# Patient Record
Sex: Male | Born: 1940
Health system: Southern US, Community
[De-identification: ages and names within clinical notes are randomized; demographics above are authoritative.]

## PROBLEM LIST (undated history)

## (undated) DIAGNOSIS — I10 Essential (primary) hypertension: Secondary | ICD-10-CM

## (undated) DIAGNOSIS — C439 Malignant melanoma of skin, unspecified: Secondary | ICD-10-CM

## (undated) DIAGNOSIS — Z951 Presence of aortocoronary bypass graft: Secondary | ICD-10-CM

## (undated) DIAGNOSIS — H9190 Unspecified hearing loss, unspecified ear: Secondary | ICD-10-CM

## (undated) HISTORY — DX: Essential (primary) hypertension: I10

## (undated) HISTORY — DX: Unspecified hearing loss, unspecified ear: H91.90

## (undated) HISTORY — DX: Presence of aortocoronary bypass graft: Z95.1

## (undated) HISTORY — PX: CATARACT EXTRACTION: SUR2

## (undated) HISTORY — DX: Malignant melanoma of skin, unspecified: C43.9

---

## 1999-04-17 ENCOUNTER — Encounter (INDEPENDENT_AMBULATORY_CARE_PROVIDER_SITE_OTHER): Payer: Self-pay | Admitting: *Deleted

## 1999-04-17 ENCOUNTER — Ambulatory Visit (HOSPITAL_COMMUNITY): Admission: RE | Admit: 1999-04-17 | Discharge: 1999-04-17 | Payer: Self-pay | Admitting: Gastroenterology

## 1999-04-27 ENCOUNTER — Inpatient Hospital Stay (HOSPITAL_COMMUNITY): Admission: RE | Admit: 1999-04-27 | Discharge: 1999-05-01 | Payer: Self-pay | Admitting: *Deleted

## 1999-04-27 ENCOUNTER — Encounter: Payer: Self-pay | Admitting: *Deleted

## 1999-04-28 ENCOUNTER — Encounter: Payer: Self-pay | Admitting: Cardiothoracic Surgery

## 1999-04-30 ENCOUNTER — Encounter: Payer: Self-pay | Admitting: Cardiothoracic Surgery

## 1999-05-30 ENCOUNTER — Encounter (HOSPITAL_COMMUNITY): Admission: RE | Admit: 1999-05-30 | Discharge: 1999-08-28 | Payer: Self-pay | Admitting: *Deleted

## 2009-02-18 ENCOUNTER — Encounter (INDEPENDENT_AMBULATORY_CARE_PROVIDER_SITE_OTHER): Payer: Self-pay | Admitting: Otolaryngology

## 2009-02-18 ENCOUNTER — Ambulatory Visit (HOSPITAL_COMMUNITY): Admission: RE | Admit: 2009-02-18 | Discharge: 2009-02-18 | Payer: Self-pay | Admitting: Otolaryngology

## 2010-01-24 ENCOUNTER — Emergency Department (HOSPITAL_COMMUNITY): Admission: EM | Admit: 2010-01-24 | Discharge: 2010-01-25 | Payer: Self-pay | Admitting: Emergency Medicine

## 2010-07-20 LAB — URINALYSIS, ROUTINE W REFLEX MICROSCOPIC
Bilirubin Urine: NEGATIVE
Glucose, UA: NEGATIVE mg/dL
Ketones, ur: NEGATIVE mg/dL
Nitrite: NEGATIVE
Protein, ur: 30 mg/dL — AB
Specific Gravity, Urine: 1.028 (ref 1.005–1.030)
Urobilinogen, UA: 1 mg/dL (ref 0.0–1.0)
pH: 5.5 (ref 5.0–8.0)

## 2010-07-20 LAB — COMPREHENSIVE METABOLIC PANEL
ALT: 12 U/L (ref 0–53)
AST: 15 U/L (ref 0–37)
CO2: 27 mEq/L (ref 19–32)
Chloride: 98 mEq/L (ref 96–112)
Creatinine, Ser: 1.51 mg/dL — ABNORMAL HIGH (ref 0.4–1.5)
GFR calc Af Amer: 55 mL/min — ABNORMAL LOW (ref >60)
GFR calc non Af Amer: 46 mL/min — ABNORMAL LOW (ref >60)
Total Bilirubin: 1 mg/dL (ref 0.3–1.2)

## 2010-07-20 LAB — CBC
Hemoglobin: 13.3 g/dL (ref 13.0–17.0)
MCH: 32 pg (ref 26.0–34.0)
MCV: 92.4 fL (ref 78.0–100.0)
RBC: 4.15 MIL/uL — ABNORMAL LOW (ref 4.22–5.81)

## 2010-07-20 LAB — URINE MICROSCOPIC-ADD ON

## 2010-07-20 LAB — URINE CULTURE: Culture: NO GROWTH

## 2010-07-20 LAB — DIFFERENTIAL
Basophils Absolute: 0 10*3/uL (ref 0.0–0.1)
Basophils Relative: 0 % (ref 0–1)
Eosinophils Absolute: 0 10*3/uL (ref 0.0–0.7)
Eosinophils Relative: 0 % (ref 0–5)
Lymphocytes Relative: 4 % — ABNORMAL LOW (ref 12–46)

## 2010-07-20 LAB — LIPASE, BLOOD: Lipase: 17 U/L (ref 11–59)

## 2010-08-10 LAB — CBC
Platelets: 166 10*3/uL (ref 150–400)
WBC: 4.3 10*3/uL (ref 4.0–10.5)

## 2010-08-10 LAB — PROTIME-INR: Prothrombin Time: 13.4 seconds (ref 11.6–15.2)

## 2010-08-10 LAB — BASIC METABOLIC PANEL
BUN: 14 mg/dL (ref 6–23)
Calcium: 9 mg/dL (ref 8.4–10.5)
Creatinine, Ser: 0.82 mg/dL (ref 0.4–1.5)
GFR calc non Af Amer: >60 mL/min (ref >60)

## 2010-08-10 LAB — APTT: aPTT: 28 seconds (ref 24–37)

## 2010-09-22 NOTE — Procedures (Signed)
[place]. [facility]  Patient:    Henry Gaines                         MRN: [id number] Proc. Date: 04/17/99 Adm. Date:  [id number] Disc. Date: [id number] Attending:  Louie Bun CC:         Redmond Baseman, M.D.                           Procedure Report  PROCEDURE:  Colonoscopy.  SURGEON:  John C. Madilyn Fireman, M.D.  INDICATIONS:  Heme positive stools.  PROCEDURE:  The patient was placed in the left lateral decubitus position and placed on the pulse monitor with continuous low-flow oxygen delivered by nasal cannula.  He was sedated with 100 mg IV Demerol and 10 mg IV Versed.  The Olympus video colonoscope was inserted into the rectum and advanced to the cecum, confirmed by transillumination of McBurneys point and visualization of the ileocecal valve and appendiceal orifice.  The prep was somewhat suboptimal in the cecum and terminal ileum and unfortunately, brownish material was adherent to the lens precluding adequate visualization, I could not wash this off and had to withdraw the scope, clean it and reinsert it all the way back to the cecum.  At this point, lavage of the cecum allowed for adequate visualization and further lavage was required at some dependent portions of the rest of the colon.  However, a good iew was eventually obtained.  The cecum, ascending, transverse and descending colon all appeared normal with no masses, polyps, diverticuli or other mucosa abnormalities. Within the sigmoid colon were seen several diverticuli and no other abnormalities. Within the rectum was seen an 8 mm polyp at approximately 10 cm, which was fulgurated by hot biopsy.  The remainder of the rectum appeared normal and retroflexed view of the anus revealed no obviously enlarged internal hemorrhoids. The colonoscope is then withdrawn and the patient returned to the recovery room in stable condition.  He tolerated the procedure well and there were no  immediate complications.  IMPRESSION: 1. Small rectal polyp. 2. Diverticulosis.  PLAN:  Await histology for determination of interval and method for future colon screening. DD:  04/17/99 TD:  04/17/99 Job: [id number]

## 2010-09-22 NOTE — Consult Note (Signed)
[place]. [facility]  Patient:    Henry Gaines                         MRN: [id number] Proc. Date: 04/27/99 Adm. Date:  [id number] Disc. Date: [id number] Attending:  Waldo Laine CC:         Gwenith Daily. Tyrone Sage, M.D.                          Consultation Report  REASON FOR CONSULTATION:  Coronary occlusive disease.  HISTORY OF PRESENT ILLNESS:  The patient is a 70 year old male with a history of hypertension and a distant history of smoking who has noted, for approximately ne year, at times "feeling weak" and fatiguing easily with shortness of breath. Over the past several months, these symptoms have continued to progressively worsen ith less exertion.  At times, the patient notes he becomes exhausted just taking a shower.  Over the past several weeks to a month, he has noted a burning type of  discomfort starting at his umbilicus and radiating up into his chest but without nausea or vomiting.  It does not radiate into the arm.  Because of these symptoms, he was referred for a Cardiolite stress test by Dr. Modesto Charon.  This was performed which demonstrated anteroapical, anterior, and inferior defects with reversibility suggestive of multivessel disease and diffuse hypokinesia with ejection fraction of 43%.  The patient has no previous history of myocardial infarction.  Cardiac catheterization was performed by Dr. Fraser Din today which shows significant three-vessel disease including total occlusion of the right coronary artery, 70% stenosis of the circumflex, and 80% stenosis of the LAD involving the diagonal branches.  Ejection fraction at 40 to 45%.  PAST MEDICAL HISTORY:  Significant for hypertension.  This was first noted about two months ago, and the patient has recently been started on hydrochlorothiazide.  CURRENT MEDICATIONS:  Hydrochlorothiazide 25 mg p.o. q.d.  ALLERGIES:  None known.  PAST SURGICAL HISTORY:  Excision of a pilonidal  cyst 35 years ago.  No other previous hospitalization.  FAMILY HISTORY:  Significant for coronary occlusive disease.  His father died of a myocardial infarction at age 74.  His mother died at age 62 from colon cancer.  SOCIAL HISTORY:  He has three children.  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  REVIEW OF SYSTEMS:  The patient denies any constitutional symptoms other than what was mentioned above.  He denies any syncope or TIAs.  He has noted in the distant past some tingling in his right leg, but this has resolved and has not recurred. He denies any blood in his stool or urine, though he does note that he has had guaiac-positive stools and underwent colonoscopy with polyp removal in December of this year.  He denies any endocrine problems.  He denies any history of diabetes.  PHYSICAL EXAMINATION:  VITAL SIGNS:  The patients blood pressure is 110/60, heart rate 69 and sinus, respiratory rate 18.  NEUROLOGIC:  The patient is awake, alert, and neurologically intact.  NECK:  He has no carotid bruits.  He has no jugular venous distension.  He has o palpable thyroid.  LUNGS:  Clear bilaterally.  HEART:  He has a normal S1, S2 without murmurs or gallops.  ABDOMEN:  Benign without palpable organomegaly or tenderness.  He has normal caliber of his aorta.  EXTREMITIES:  Lower extremities reveal adequate vein for bypass.  He has  palpable DP and PT pulses bilaterally.  I have reviewed the patients films, and I have discussed the risks and options with him.  With significant symptoms, positive Cardiolite stress test, and significant three-vessel coronary occlusive disease, I agree with the recommendation for coronary artery bypass grafting and have recommended this to the patient.  The risks and options were discussed with the patient including the risk of death, infection, stroke, myocardial infarction, bleeding, and blood transfusion.  The patient has had his questions  answered and is willing to proceed. We will plan bypass surgery December 22.  The patient is agreeable with this plan. DD:  04/27/99 TD:  04/27/99 Job: [id number]

## 2010-09-22 NOTE — Cardiovascular Report (Signed)
[place]. [facility]  Patient:    Henry Gaines                         MRN: [id number] Proc. Date: 04/27/99 Adm. Date:  [id number] Attending:  Waldo Laine CC:         Redmond Baseman, M.D.                        Cardiac Catheterization  PROCEDURE PERFORMED:  Left heart catheterization, coronary angiography, single-plane ventriculogram, left internal mammary artery.  INDICATION FOR PROCEDURE:  Reversible defect on nuclear thallium suggestive of multivessel disease, diffuse hypokinesis around 43%, and unstable angina.  REFERRING PHYSICIAN:  Redmond Baseman, M.D.  PROCEDURE:  After obtaining written informed consent, the patient was brought to the cardiac catheterization lab in a postabsorptive state.  Preoperative sedation was achieved using IV Versed, IV Benadryl.  The right femoral head was prepped nd draped in the usual sterile fashion.  Local anesthesia was achieved using 1% Xylocaine.  A 6-French hemostasis sheath was placed into the right femoral artery using a modified Seldinger technique.  Selective coronary angiography was performed using a JL4 and JR4 Judkins catheter.  Nonionic contrast was used and was hand injected for the coronaries.  Single-plane ventriculogram was performed in the AO position using a 6-French pigtail curved catheter.  Nonionic contrast was used nd was power injected.  All catheter exchanges were made over a guide wire, and a hemostasis sheath was flushed after each catheter exchange.  The left internal mammary artery was engaged using the JR4.  Following the procedure, the films were reviewed with Dr. Katrinka Blazing, and it was felt that the disease was more amenable to surgical revascularization, and a surgical consultation was obtained.  The patient was transferred back to the holding area in satisfactory condition.  The hemostasis sheath was removed.  Hemostasis was achieved using digital  pressure.  FINDINGS:  The aortic pressure was 109/64; LV pressure was 109/18.  Single-plane ventriculogram revealed inferior basilar akinesis with anterior hypokinesis.  The ejection fraction was approximately 45%.  Coronary angiography: 1. The left main coronary artery bifurcated into the left anterior descending and    circumflex vessel.  The left main coronary artery was short.  There was no    significant disease in the left main coronary artery. 2. The left anterior descending artery gave rise to a large D1 and moderate D2    and a ______  apical recurrent bridge.  There was a 70% ostial LAD lesion    followed by an 80% proximal lesion.  This was followed by a trifurcation lesion    involving the first septal, first diagonal, and LAD branch up to 50-60%. 3. The circumflex vessel gave rise to a moderate OM-1, large OM-2, and small OM-3.    There was left-to-right collaterals from the circumflex vessel, and the    circumflex had luminal irregularities up to 30%. 4. The right coronary artery was dominant for the posterior circulation and was    subtotally occluded at its mid vessel point.  IMPRESSION:  Critical disease involving the proximal LAD, ostial LAD, and trifurcating branch.  The right coronary artery was subtotally occluded.  The left internal mammary artery was large and patent.  RECOMMENDATION:  Surgical revascularization. DD:  04/27/99 TD:  04/28/99 Job: [id number]

## 2010-10-27 ENCOUNTER — Emergency Department (HOSPITAL_COMMUNITY)
Admission: EM | Admit: 2010-10-27 | Discharge: 2010-10-27 | Disposition: A | Discharge status: Home / Self Care | Payer: Medicare Other | Attending: Emergency Medicine | Admitting: Emergency Medicine

## 2010-10-27 ENCOUNTER — Emergency Department (HOSPITAL_COMMUNITY): Payer: Medicare Other

## 2010-10-27 DIAGNOSIS — IMO0001 Reserved for inherently not codable concepts without codable children: Secondary | ICD-10-CM | POA: Insufficient documentation

## 2010-10-27 DIAGNOSIS — R413 Other amnesia: Secondary | ICD-10-CM | POA: Insufficient documentation

## 2010-10-27 DIAGNOSIS — M25519 Pain in unspecified shoulder: Secondary | ICD-10-CM | POA: Insufficient documentation

## 2010-10-27 DIAGNOSIS — W11XXXA Fall on and from ladder, initial encounter: Secondary | ICD-10-CM | POA: Insufficient documentation

## 2010-10-27 DIAGNOSIS — I10 Essential (primary) hypertension: Secondary | ICD-10-CM | POA: Insufficient documentation

## 2010-10-27 DIAGNOSIS — K219 Gastro-esophageal reflux disease without esophagitis: Secondary | ICD-10-CM | POA: Insufficient documentation

## 2010-10-27 DIAGNOSIS — R404 Transient alteration of awareness: Secondary | ICD-10-CM | POA: Insufficient documentation

## 2010-10-27 DIAGNOSIS — M25559 Pain in unspecified hip: Secondary | ICD-10-CM | POA: Insufficient documentation

## 2010-10-27 DIAGNOSIS — E785 Hyperlipidemia, unspecified: Secondary | ICD-10-CM | POA: Insufficient documentation

## 2010-10-27 DIAGNOSIS — S060X9A Concussion with loss of consciousness of unspecified duration, initial encounter: Secondary | ICD-10-CM | POA: Insufficient documentation

## 2010-10-27 DIAGNOSIS — S0100XA Unspecified open wound of scalp, initial encounter: Secondary | ICD-10-CM | POA: Insufficient documentation

## 2010-10-27 DIAGNOSIS — Z951 Presence of aortocoronary bypass graft: Secondary | ICD-10-CM | POA: Insufficient documentation

## 2010-10-27 DIAGNOSIS — Y92009 Unspecified place in unspecified non-institutional (private) residence as the place of occurrence of the external cause: Secondary | ICD-10-CM | POA: Insufficient documentation

## 2011-05-21 DIAGNOSIS — D3130 Benign neoplasm of unspecified choroid: Secondary | ICD-10-CM | POA: Diagnosis not present

## 2011-06-19 DIAGNOSIS — D1801 Hemangioma of skin and subcutaneous tissue: Secondary | ICD-10-CM | POA: Diagnosis not present

## 2011-06-19 DIAGNOSIS — L570 Actinic keratosis: Secondary | ICD-10-CM | POA: Diagnosis not present

## 2011-09-20 DIAGNOSIS — Z125 Encounter for screening for malignant neoplasm of prostate: Secondary | ICD-10-CM | POA: Diagnosis not present

## 2011-09-20 DIAGNOSIS — E785 Hyperlipidemia, unspecified: Secondary | ICD-10-CM | POA: Diagnosis not present

## 2011-09-20 DIAGNOSIS — I10 Essential (primary) hypertension: Secondary | ICD-10-CM | POA: Diagnosis not present

## 2011-09-24 DIAGNOSIS — I10 Essential (primary) hypertension: Secondary | ICD-10-CM | POA: Diagnosis not present

## 2011-09-24 DIAGNOSIS — Z23 Encounter for immunization: Secondary | ICD-10-CM | POA: Diagnosis not present

## 2011-09-24 DIAGNOSIS — E785 Hyperlipidemia, unspecified: Secondary | ICD-10-CM | POA: Diagnosis not present

## 2011-09-24 DIAGNOSIS — Z125 Encounter for screening for malignant neoplasm of prostate: Secondary | ICD-10-CM | POA: Diagnosis not present

## 2011-09-24 DIAGNOSIS — Z0000 Encounter for general adult medical examination without abnormal findings: Secondary | ICD-10-CM | POA: Diagnosis not present

## 2011-11-01 DIAGNOSIS — H903 Sensorineural hearing loss, bilateral: Secondary | ICD-10-CM | POA: Diagnosis not present

## 2011-11-07 IMAGING — CT CT HEAD W/O CM
2 series · 16 of 30 positions shown, 18 images · non-contrast
Comparison: None.

CLINICAL DATA: Fell off ladder with laceration to the back of the
head

CT HEAD WITHOUT CONTRAST
TECHNIQUE: Contiguous axial images were obtained from the base of
the skull through the vertex without contrast.

[Series 2: head w/o · axial · non-contrast · 0.49mm/px · z∈[+123,+248]mm · 8 of 33 slices shown, 10 images]
[im 4/33  brain]
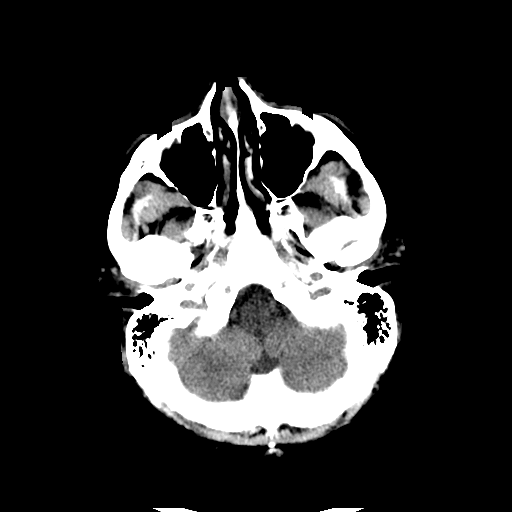
[im 4/33  bone]
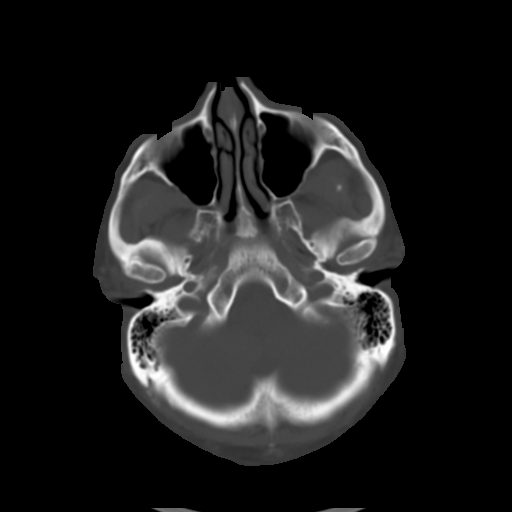
[im 8/33  brain]
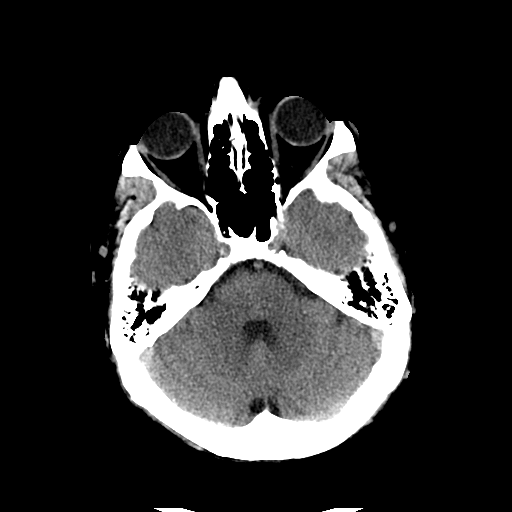
[im 11/33  brain]
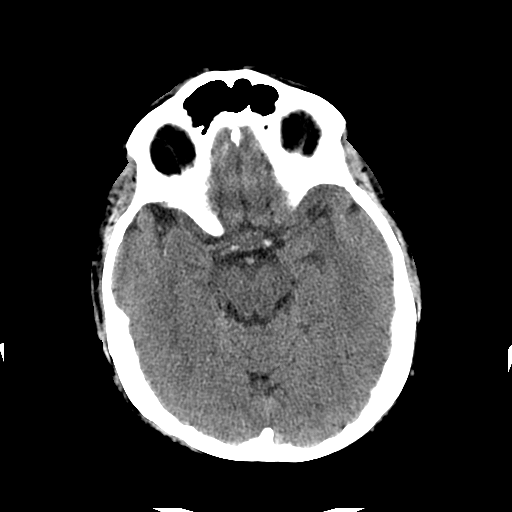
[im 15/33  brain]
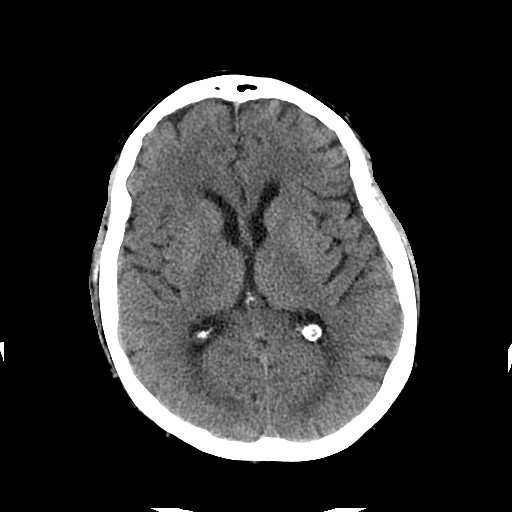
[im 18/33  brain]
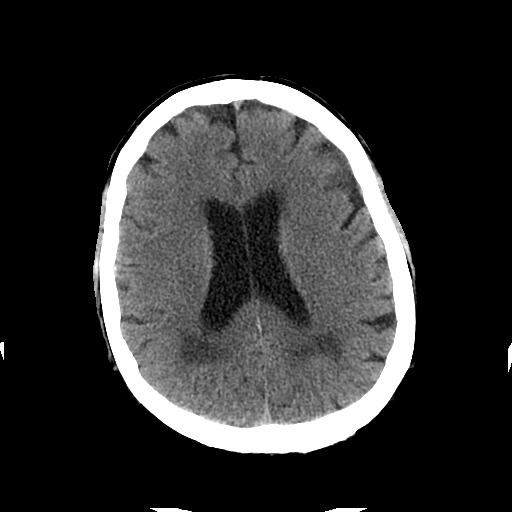
[im 18/33  bone]
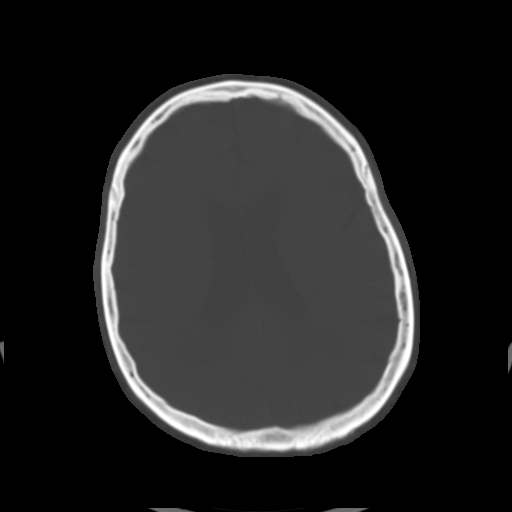
[im 22/33  brain]
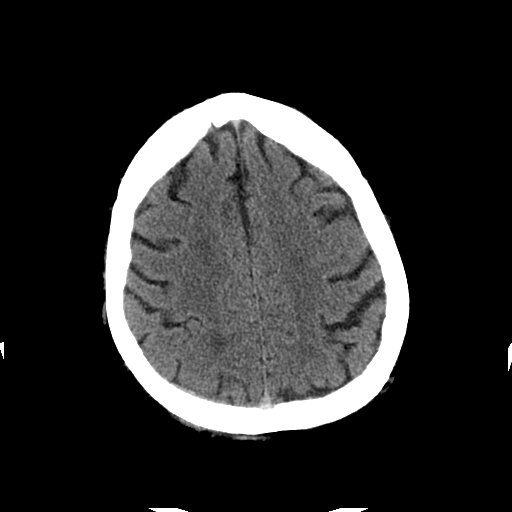
[im 25/33  brain]
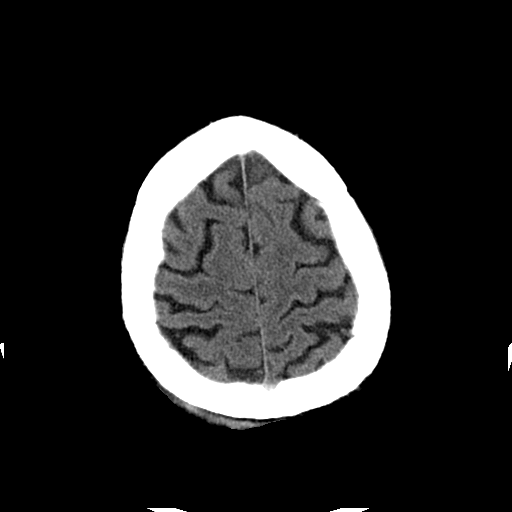
[im 29/33  brain]
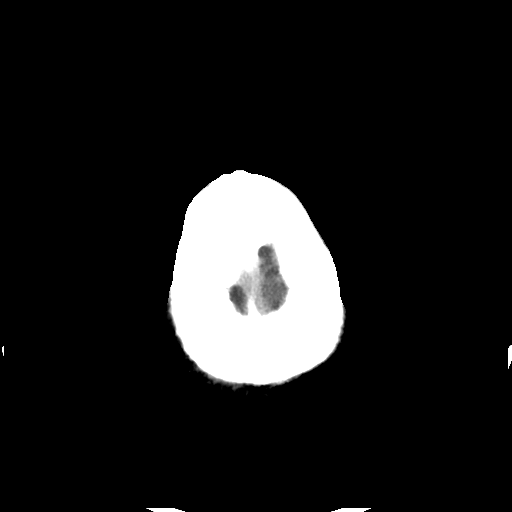

[Series 3: head w/o bone · axial · non-contrast · 0.49mm/px · z∈[+123,+251]mm · 8 of 65 slices shown]
[im 7/65  bone]
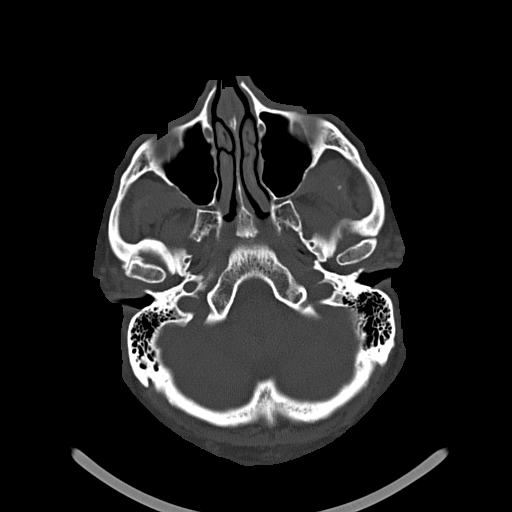
[im 14/65  bone]
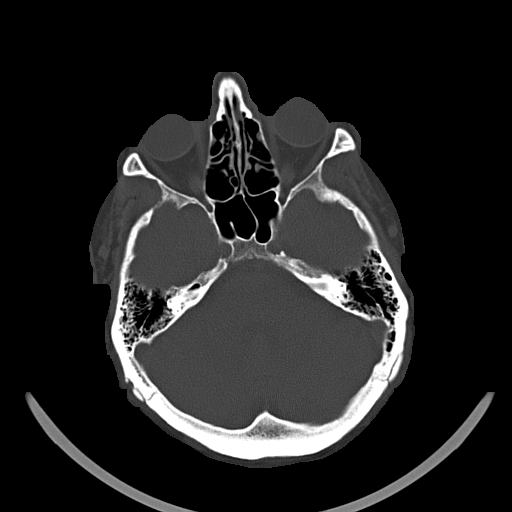
[im 21/65  bone]
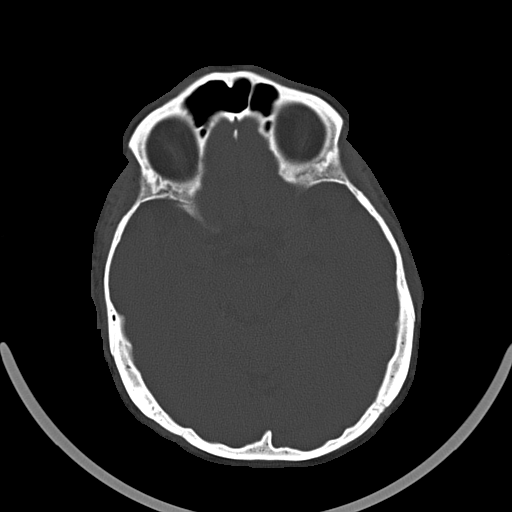
[im 27/65  bone]
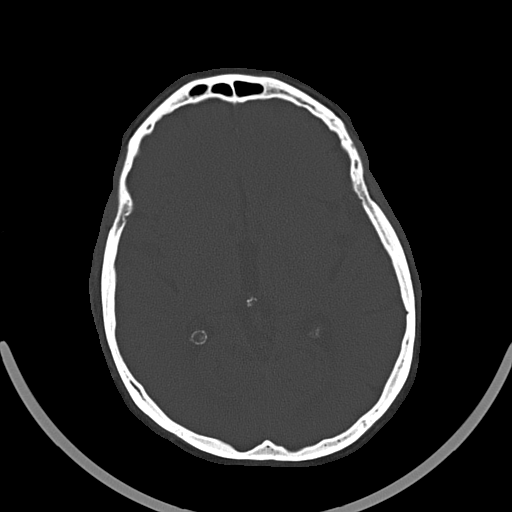
[im 38/65  bone]
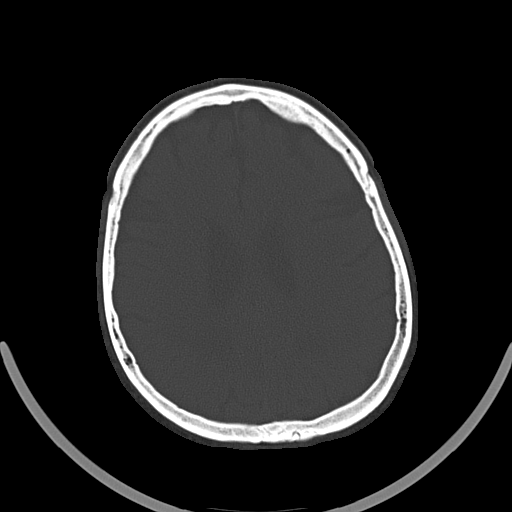
[im 44/65  bone]
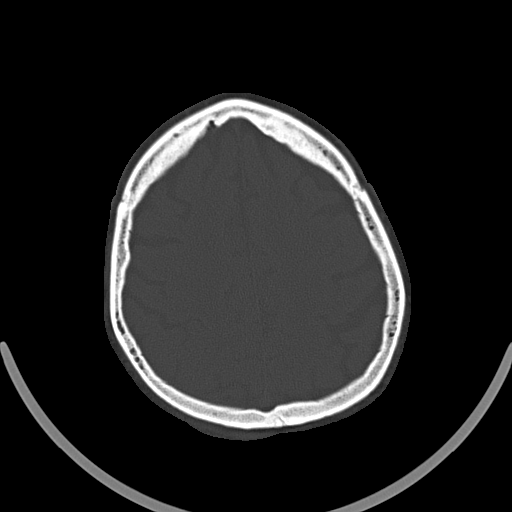
[im 51/65  bone]
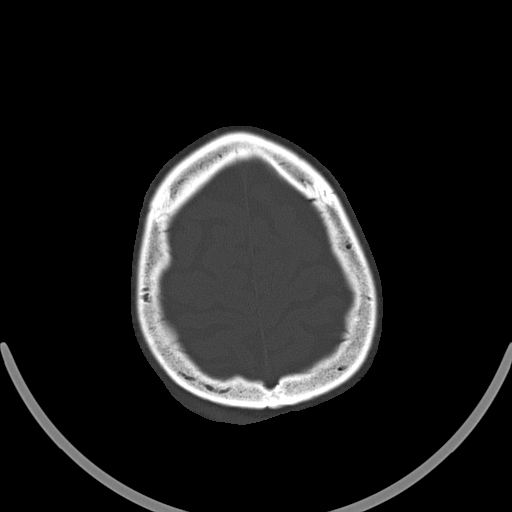
[im 58/65  bone]
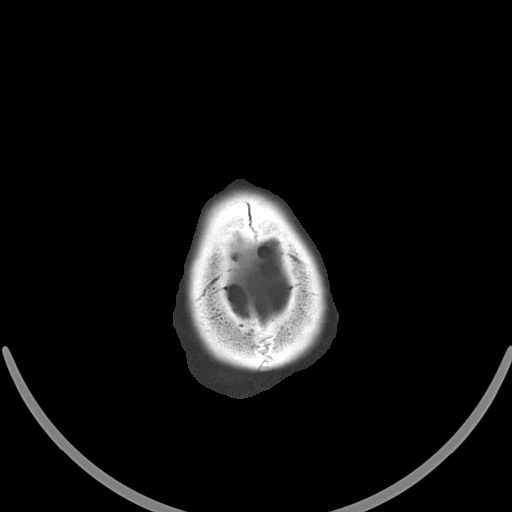

[16 of 30 positions shown; findings below may reference images not displayed]

FINDINGS: The ventricular system is slightly prominent as are the
cortical sulci, indicative of diffuse atrophy.  The septum is
midline in position.  Moderate small vessel ischemic change is
noted particularly bilaterally in the posterior parietal regions.
No hemorrhage, mass lesion, or acute infarction is seen.  On bone
window images, a small retention cyst is noted in the lateral right
maxillary sinus.  The remainder of the paranasal sinuses are clear
and the mastoid air cells are pneumatized.  No acute calvarial
abnormality is seen.  A probable small right posterior parietal
scalp hematoma is present.
IMPRESSION: 1.  Atrophy and moderate small vessel ischemic change.  No acute
intracranial abnormality.
2.  Small right post parietal scalp hematoma.

## 2012-05-03 DIAGNOSIS — Z23 Encounter for immunization: Secondary | ICD-10-CM | POA: Diagnosis not present

## 2012-10-02 DIAGNOSIS — I10 Essential (primary) hypertension: Secondary | ICD-10-CM | POA: Diagnosis not present

## 2012-10-02 DIAGNOSIS — E785 Hyperlipidemia, unspecified: Secondary | ICD-10-CM | POA: Diagnosis not present

## 2012-10-02 DIAGNOSIS — I2510 Atherosclerotic heart disease of native coronary artery without angina pectoris: Secondary | ICD-10-CM | POA: Diagnosis not present

## 2012-10-06 DIAGNOSIS — I10 Essential (primary) hypertension: Secondary | ICD-10-CM | POA: Diagnosis not present

## 2012-10-06 DIAGNOSIS — I2510 Atherosclerotic heart disease of native coronary artery without angina pectoris: Secondary | ICD-10-CM | POA: Diagnosis not present

## 2012-10-06 DIAGNOSIS — R1310 Dysphagia, unspecified: Secondary | ICD-10-CM | POA: Diagnosis not present

## 2012-10-06 DIAGNOSIS — Z79899 Other long term (current) drug therapy: Secondary | ICD-10-CM | POA: Diagnosis not present

## 2012-10-06 DIAGNOSIS — Z0000 Encounter for general adult medical examination without abnormal findings: Secondary | ICD-10-CM | POA: Diagnosis not present

## 2012-10-06 DIAGNOSIS — L570 Actinic keratosis: Secondary | ICD-10-CM | POA: Diagnosis not present

## 2012-10-06 DIAGNOSIS — E785 Hyperlipidemia, unspecified: Secondary | ICD-10-CM | POA: Diagnosis not present

## 2012-10-06 DIAGNOSIS — N529 Male erectile dysfunction, unspecified: Secondary | ICD-10-CM | POA: Diagnosis not present

## 2012-10-27 DIAGNOSIS — L570 Actinic keratosis: Secondary | ICD-10-CM | POA: Diagnosis not present

## 2012-10-27 DIAGNOSIS — D235 Other benign neoplasm of skin of trunk: Secondary | ICD-10-CM | POA: Diagnosis not present

## 2012-11-10 DIAGNOSIS — R1314 Dysphagia, pharyngoesophageal phase: Secondary | ICD-10-CM | POA: Diagnosis not present

## 2012-11-10 DIAGNOSIS — Z1211 Encounter for screening for malignant neoplasm of colon: Secondary | ICD-10-CM | POA: Diagnosis not present

## 2012-12-08 DIAGNOSIS — K449 Diaphragmatic hernia without obstruction or gangrene: Secondary | ICD-10-CM | POA: Diagnosis not present

## 2012-12-08 DIAGNOSIS — K5730 Diverticulosis of large intestine without perforation or abscess without bleeding: Secondary | ICD-10-CM | POA: Diagnosis not present

## 2012-12-08 DIAGNOSIS — Z800 Family history of malignant neoplasm of digestive organs: Secondary | ICD-10-CM | POA: Diagnosis not present

## 2012-12-08 DIAGNOSIS — R1314 Dysphagia, pharyngoesophageal phase: Secondary | ICD-10-CM | POA: Diagnosis not present

## 2012-12-08 DIAGNOSIS — Z1211 Encounter for screening for malignant neoplasm of colon: Secondary | ICD-10-CM | POA: Diagnosis not present

## 2013-02-14 DIAGNOSIS — Z23 Encounter for immunization: Secondary | ICD-10-CM | POA: Diagnosis not present

## 2013-04-13 DIAGNOSIS — I2510 Atherosclerotic heart disease of native coronary artery without angina pectoris: Secondary | ICD-10-CM | POA: Diagnosis not present

## 2013-04-13 DIAGNOSIS — L570 Actinic keratosis: Secondary | ICD-10-CM | POA: Diagnosis not present

## 2013-04-13 DIAGNOSIS — E785 Hyperlipidemia, unspecified: Secondary | ICD-10-CM | POA: Diagnosis not present

## 2013-04-13 DIAGNOSIS — R1310 Dysphagia, unspecified: Secondary | ICD-10-CM | POA: Diagnosis not present

## 2013-04-13 DIAGNOSIS — Z0000 Encounter for general adult medical examination without abnormal findings: Secondary | ICD-10-CM | POA: Diagnosis not present

## 2013-04-13 DIAGNOSIS — Z79899 Other long term (current) drug therapy: Secondary | ICD-10-CM | POA: Diagnosis not present

## 2013-04-13 DIAGNOSIS — N529 Male erectile dysfunction, unspecified: Secondary | ICD-10-CM | POA: Diagnosis not present

## 2013-04-13 DIAGNOSIS — I10 Essential (primary) hypertension: Secondary | ICD-10-CM | POA: Diagnosis not present

## 2013-04-20 DIAGNOSIS — Z23 Encounter for immunization: Secondary | ICD-10-CM | POA: Diagnosis not present

## 2013-04-20 DIAGNOSIS — J069 Acute upper respiratory infection, unspecified: Secondary | ICD-10-CM | POA: Diagnosis not present

## 2013-04-20 DIAGNOSIS — E785 Hyperlipidemia, unspecified: Secondary | ICD-10-CM | POA: Diagnosis not present

## 2013-04-20 DIAGNOSIS — I10 Essential (primary) hypertension: Secondary | ICD-10-CM | POA: Diagnosis not present

## 2013-10-12 DIAGNOSIS — I2510 Atherosclerotic heart disease of native coronary artery without angina pectoris: Secondary | ICD-10-CM | POA: Diagnosis not present

## 2013-10-12 DIAGNOSIS — N529 Male erectile dysfunction, unspecified: Secondary | ICD-10-CM | POA: Diagnosis not present

## 2013-10-12 DIAGNOSIS — I10 Essential (primary) hypertension: Secondary | ICD-10-CM | POA: Diagnosis not present

## 2013-10-12 DIAGNOSIS — Z23 Encounter for immunization: Secondary | ICD-10-CM | POA: Diagnosis not present

## 2013-10-12 DIAGNOSIS — Z0000 Encounter for general adult medical examination without abnormal findings: Secondary | ICD-10-CM | POA: Diagnosis not present

## 2013-10-12 DIAGNOSIS — E785 Hyperlipidemia, unspecified: Secondary | ICD-10-CM | POA: Diagnosis not present

## 2014-10-29 DIAGNOSIS — I10 Essential (primary) hypertension: Secondary | ICD-10-CM | POA: Diagnosis not present

## 2014-10-29 DIAGNOSIS — E785 Hyperlipidemia, unspecified: Secondary | ICD-10-CM | POA: Diagnosis not present

## 2014-10-29 DIAGNOSIS — Z125 Encounter for screening for malignant neoplasm of prostate: Secondary | ICD-10-CM | POA: Diagnosis not present

## 2014-11-01 DIAGNOSIS — Z125 Encounter for screening for malignant neoplasm of prostate: Secondary | ICD-10-CM | POA: Diagnosis not present

## 2014-11-01 DIAGNOSIS — N400 Enlarged prostate without lower urinary tract symptoms: Secondary | ICD-10-CM | POA: Diagnosis not present

## 2014-11-01 DIAGNOSIS — Z0001 Encounter for general adult medical examination with abnormal findings: Secondary | ICD-10-CM | POA: Diagnosis not present

## 2014-11-01 DIAGNOSIS — N529 Male erectile dysfunction, unspecified: Secondary | ICD-10-CM | POA: Diagnosis not present

## 2014-11-01 DIAGNOSIS — L570 Actinic keratosis: Secondary | ICD-10-CM | POA: Diagnosis not present

## 2014-11-01 DIAGNOSIS — I10 Essential (primary) hypertension: Secondary | ICD-10-CM | POA: Diagnosis not present

## 2014-11-01 DIAGNOSIS — E784 Other hyperlipidemia: Secondary | ICD-10-CM | POA: Diagnosis not present

## 2015-02-14 DIAGNOSIS — D4981 Neoplasm of unspecified behavior of retina and choroid: Secondary | ICD-10-CM | POA: Diagnosis not present

## 2015-02-14 DIAGNOSIS — H524 Presbyopia: Secondary | ICD-10-CM | POA: Diagnosis not present

## 2015-11-18 DIAGNOSIS — Z125 Encounter for screening for malignant neoplasm of prostate: Secondary | ICD-10-CM | POA: Diagnosis not present

## 2015-11-18 DIAGNOSIS — Z0001 Encounter for general adult medical examination with abnormal findings: Secondary | ICD-10-CM | POA: Diagnosis not present

## 2015-11-18 DIAGNOSIS — N400 Enlarged prostate without lower urinary tract symptoms: Secondary | ICD-10-CM | POA: Diagnosis not present

## 2015-11-18 DIAGNOSIS — E784 Other hyperlipidemia: Secondary | ICD-10-CM | POA: Diagnosis not present

## 2015-11-18 DIAGNOSIS — I10 Essential (primary) hypertension: Secondary | ICD-10-CM | POA: Diagnosis not present

## 2015-11-18 DIAGNOSIS — L570 Actinic keratosis: Secondary | ICD-10-CM | POA: Diagnosis not present

## 2015-11-18 DIAGNOSIS — N529 Male erectile dysfunction, unspecified: Secondary | ICD-10-CM | POA: Diagnosis not present

## 2015-11-21 DIAGNOSIS — M1990 Unspecified osteoarthritis, unspecified site: Secondary | ICD-10-CM | POA: Diagnosis not present

## 2015-11-21 DIAGNOSIS — E784 Other hyperlipidemia: Secondary | ICD-10-CM | POA: Diagnosis not present

## 2015-11-21 DIAGNOSIS — R1314 Dysphagia, pharyngoesophageal phase: Secondary | ICD-10-CM | POA: Diagnosis not present

## 2015-11-21 DIAGNOSIS — Z0000 Encounter for general adult medical examination without abnormal findings: Secondary | ICD-10-CM | POA: Diagnosis not present

## 2015-11-21 DIAGNOSIS — Z125 Encounter for screening for malignant neoplasm of prostate: Secondary | ICD-10-CM | POA: Diagnosis not present

## 2015-11-21 DIAGNOSIS — H9190 Unspecified hearing loss, unspecified ear: Secondary | ICD-10-CM | POA: Diagnosis not present

## 2015-11-21 DIAGNOSIS — N529 Male erectile dysfunction, unspecified: Secondary | ICD-10-CM | POA: Diagnosis not present

## 2015-11-21 DIAGNOSIS — N400 Enlarged prostate without lower urinary tract symptoms: Secondary | ICD-10-CM | POA: Diagnosis not present

## 2015-11-21 DIAGNOSIS — I10 Essential (primary) hypertension: Secondary | ICD-10-CM | POA: Diagnosis not present

## 2015-11-21 DIAGNOSIS — Z6835 Body mass index (BMI) 35.0-35.9, adult: Secondary | ICD-10-CM | POA: Diagnosis not present

## 2015-11-21 DIAGNOSIS — I2510 Atherosclerotic heart disease of native coronary artery without angina pectoris: Secondary | ICD-10-CM | POA: Diagnosis not present

## 2015-11-22 ENCOUNTER — Other Ambulatory Visit: Payer: Self-pay | Admitting: Family Medicine

## 2015-11-22 DIAGNOSIS — I2583 Coronary atherosclerosis due to lipid rich plaque: Principal | ICD-10-CM

## 2015-11-22 DIAGNOSIS — I2510 Atherosclerotic heart disease of native coronary artery without angina pectoris: Secondary | ICD-10-CM

## 2015-11-23 ENCOUNTER — Ambulatory Visit
Admission: RE | Admit: 2015-11-23 | Discharge: 2015-11-23 | Disposition: A | Discharge status: Home / Self Care | Payer: Medicare Other | Source: Ambulatory Visit | Attending: Family Medicine | Admitting: Family Medicine

## 2015-11-23 DIAGNOSIS — I2510 Atherosclerotic heart disease of native coronary artery without angina pectoris: Secondary | ICD-10-CM

## 2015-11-23 DIAGNOSIS — I6521 Occlusion and stenosis of right carotid artery: Secondary | ICD-10-CM | POA: Diagnosis not present

## 2015-11-23 DIAGNOSIS — I2583 Coronary atherosclerosis due to lipid rich plaque: Principal | ICD-10-CM

## 2016-02-01 DIAGNOSIS — Z23 Encounter for immunization: Secondary | ICD-10-CM | POA: Diagnosis not present

## 2016-12-03 IMAGING — US US CAROTID DUPLEX BILAT
1 series · 13 of 24 positions shown · non-contrast
Comparison: None.

CLINICAL DATA: Coronary artery disease, hypertension and
hyperlipidemia.

EXAM:
BILATERAL CAROTID DUPLEX ULTRASOUND
TECHNIQUE: Gray scale imaging, color Doppler and duplex ultrasound were
performed of bilateral carotid and vertebral arteries in the neck.

[Series 1: us carotid duplex bilat · 0.08mm/px · 13 of 70 slices shown]
[im 1/70]
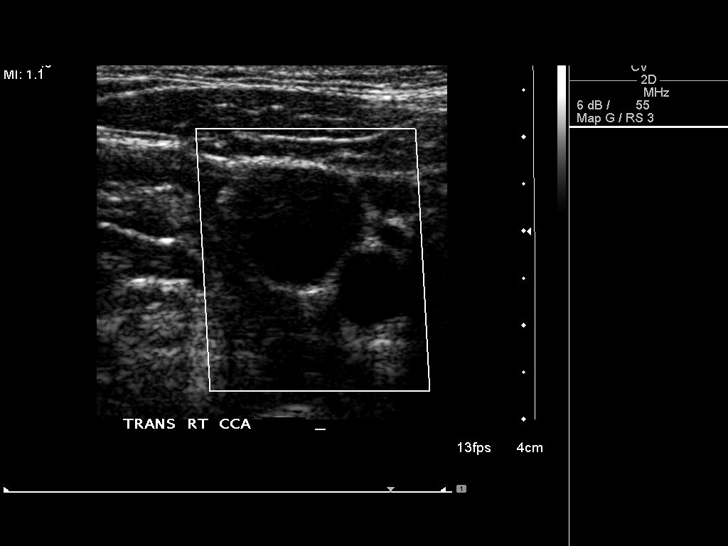
[im 7/70]
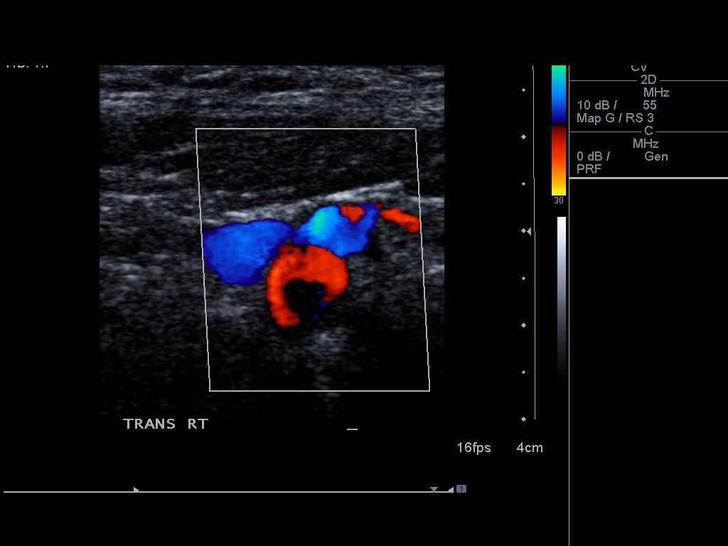
[im 13/70]
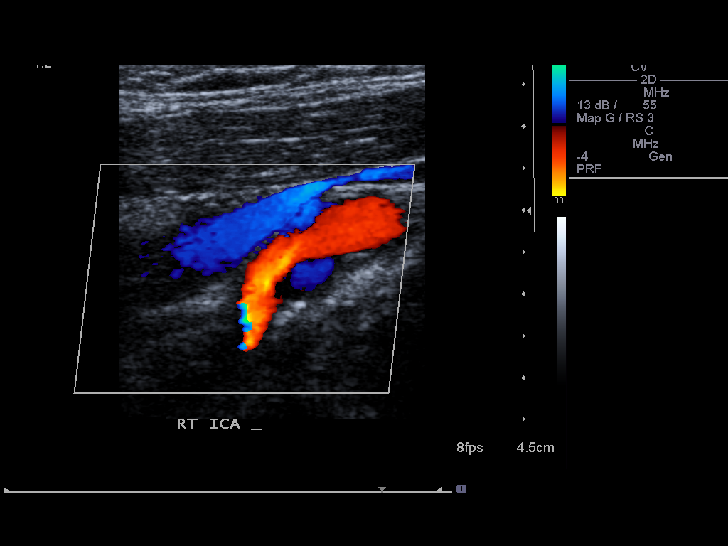
[im 19/70]
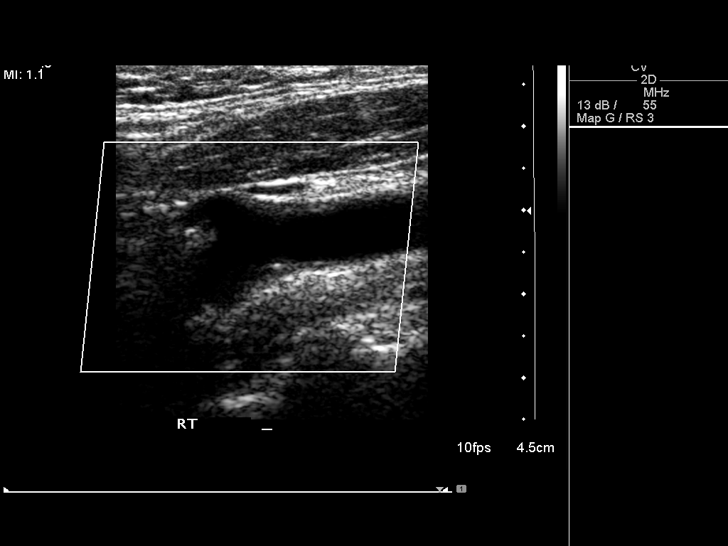
[im 25/70]
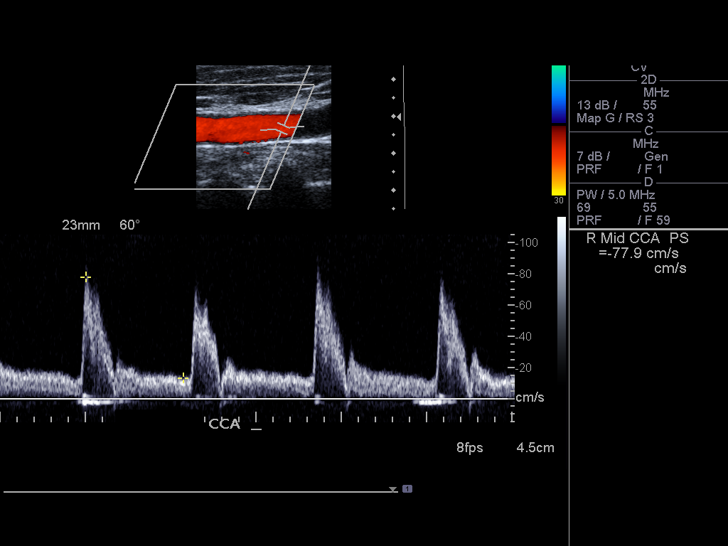
[im 31/70]
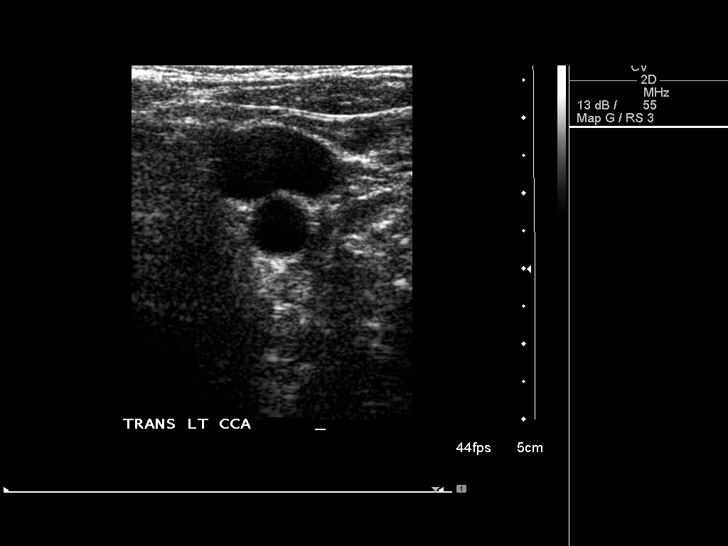
[im 37/70]
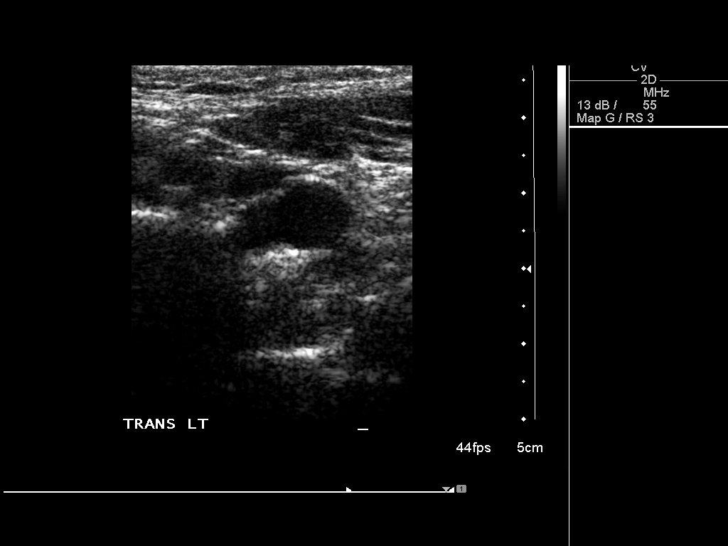
[im 40/70]
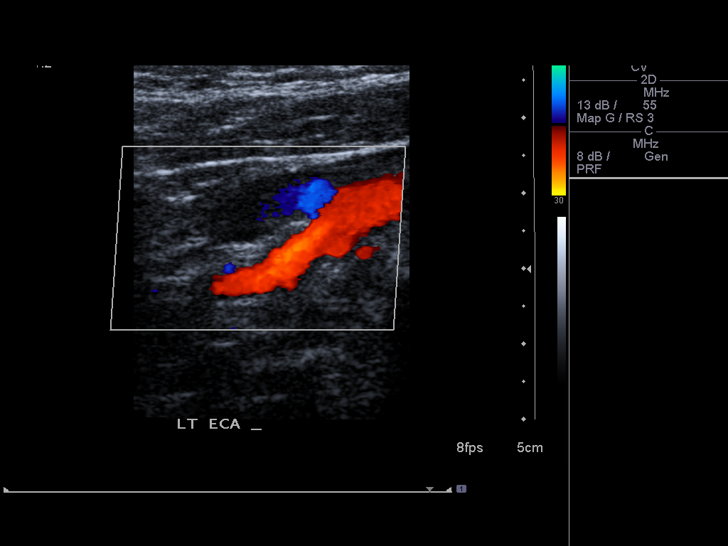
[im 46/70]
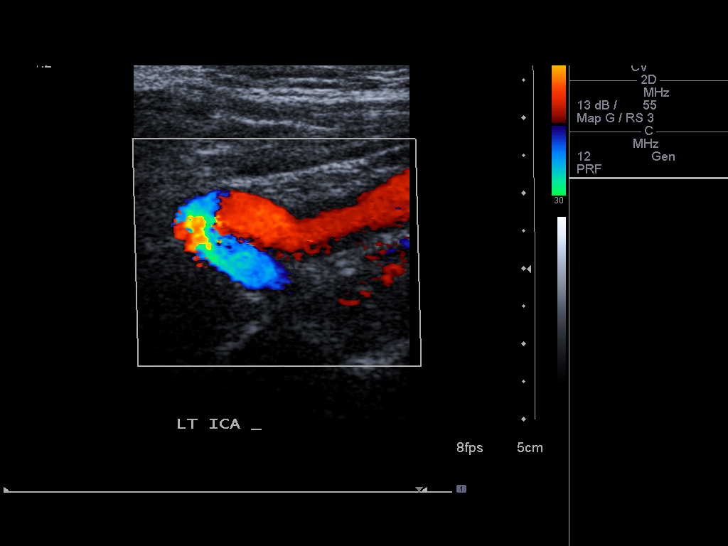
[im 52/70]
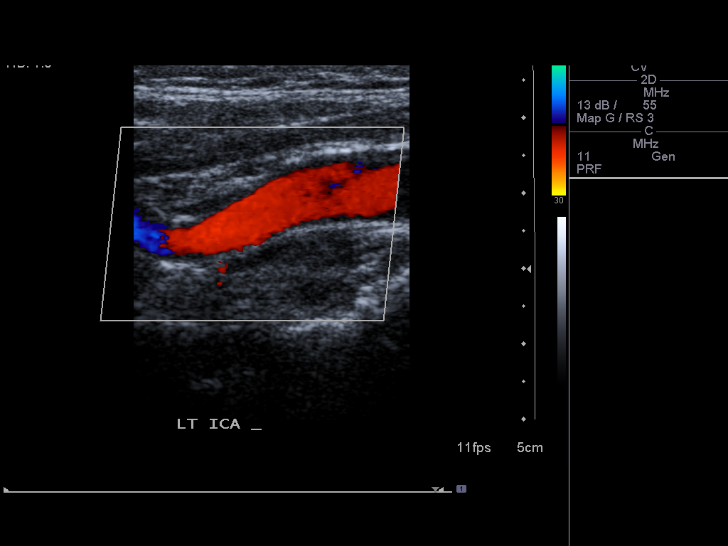
[im 58/70]
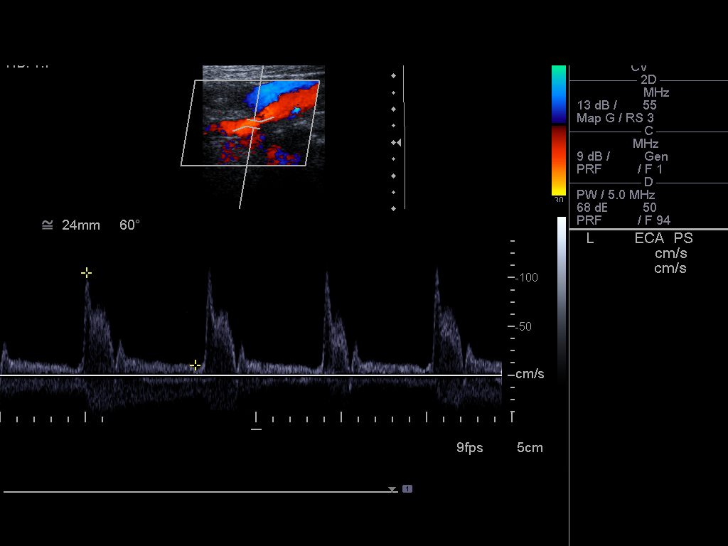
[im 64/70]
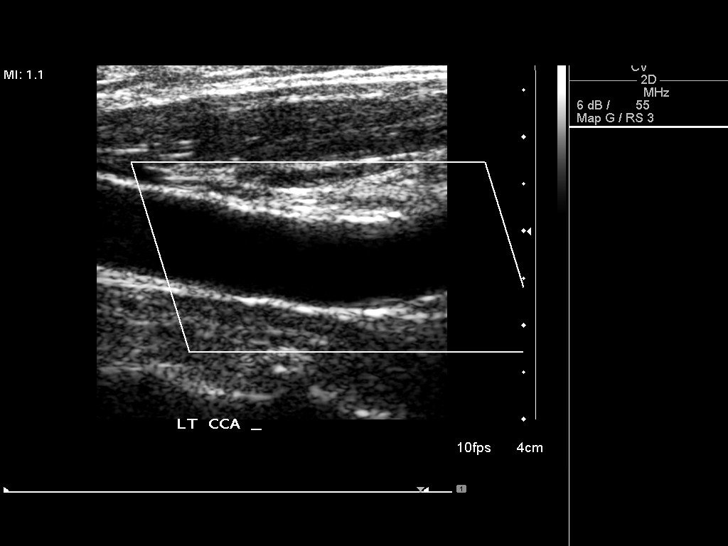
[im 70/70]
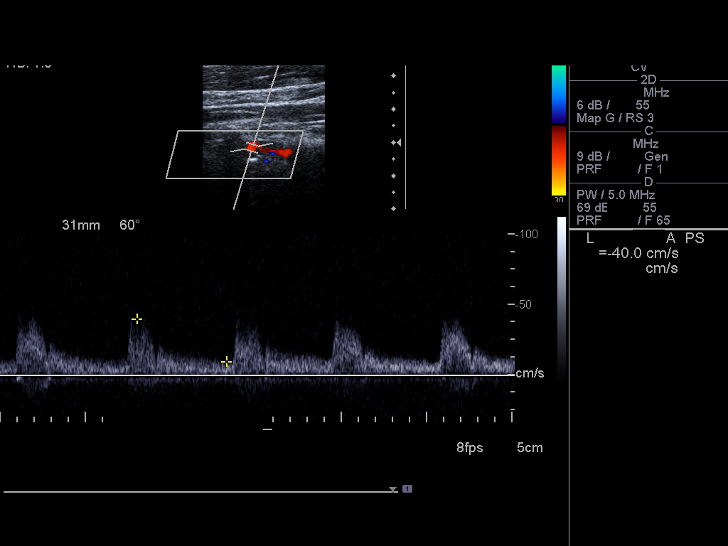

[13 of 24 positions shown; findings below may reference images not displayed]

FINDINGS: Criteria: Quantification of carotid stenosis is based on velocity
parameters that correlate the residual internal carotid diameter
with NASCET-based stenosis levels, using the diameter of the distal
internal carotid lumen as the denominator for stenosis measurement.

The following velocity measurements were obtained:

RIGHT

ICA:  100/13 cm/sec

CCA:  78/13 cm/sec

SYSTOLIC ICA/CCA RATIO:

DIASTOLIC ICA/CCA RATIO:

ECA:  126 cm/sec

LEFT

ICA:  132/34 distal, 82/23 mid cm/sec

CCA:  77/11 cm/sec

SYSTOLIC ICA/CCA RATIO:

DIASTOLIC ICA/CCA RATIO: 3.0 when comparing with the distal velocity

ECA:  105 cm/sec

RIGHT CAROTID ARTERY: There is a mild amount of partially calcified
plaque in the distal bulb and proximal ICA. Velocities and waveforms
are unremarkable and estimated right ICA stenosis is less than 50%.

RIGHT VERTEBRAL ARTERY: Antegrade flow with normal waveform and
velocity.

LEFT CAROTID ARTERY: The left internal carotid artery is extremely
tortuous. No focal plaque is identified. There is no evidence of
left ICA stenosis. Mildly elevated distal ICA velocities are
secondary to tortuosity.

LEFT VERTEBRAL ARTERY: Antegrade flow with normal waveform and
velocity.
IMPRESSION: Mild amount of plaque at the level of the right carotid bulb and
proximal right ICA. Estimated right ICA stenosis is less than 50%.
No evidence of left ICA plaque or stenosis.

## 2016-12-20 DIAGNOSIS — N529 Male erectile dysfunction, unspecified: Secondary | ICD-10-CM | POA: Diagnosis not present

## 2016-12-20 DIAGNOSIS — Z125 Encounter for screening for malignant neoplasm of prostate: Secondary | ICD-10-CM | POA: Diagnosis not present

## 2016-12-20 DIAGNOSIS — Z0000 Encounter for general adult medical examination without abnormal findings: Secondary | ICD-10-CM | POA: Diagnosis not present

## 2016-12-20 DIAGNOSIS — N400 Enlarged prostate without lower urinary tract symptoms: Secondary | ICD-10-CM | POA: Diagnosis not present

## 2016-12-20 DIAGNOSIS — I2510 Atherosclerotic heart disease of native coronary artery without angina pectoris: Secondary | ICD-10-CM | POA: Diagnosis not present

## 2016-12-20 DIAGNOSIS — R1314 Dysphagia, pharyngoesophageal phase: Secondary | ICD-10-CM | POA: Diagnosis not present

## 2016-12-20 DIAGNOSIS — I10 Essential (primary) hypertension: Secondary | ICD-10-CM | POA: Diagnosis not present

## 2016-12-20 DIAGNOSIS — E784 Other hyperlipidemia: Secondary | ICD-10-CM | POA: Diagnosis not present

## 2016-12-20 DIAGNOSIS — H9190 Unspecified hearing loss, unspecified ear: Secondary | ICD-10-CM | POA: Diagnosis not present

## 2016-12-24 DIAGNOSIS — Z0000 Encounter for general adult medical examination without abnormal findings: Secondary | ICD-10-CM | POA: Diagnosis not present

## 2016-12-24 DIAGNOSIS — I10 Essential (primary) hypertension: Secondary | ICD-10-CM | POA: Diagnosis not present

## 2016-12-24 DIAGNOSIS — Z125 Encounter for screening for malignant neoplasm of prostate: Secondary | ICD-10-CM | POA: Diagnosis not present

## 2016-12-24 DIAGNOSIS — Z23 Encounter for immunization: Secondary | ICD-10-CM | POA: Diagnosis not present

## 2016-12-24 DIAGNOSIS — N529 Male erectile dysfunction, unspecified: Secondary | ICD-10-CM | POA: Diagnosis not present

## 2016-12-24 DIAGNOSIS — E784 Other hyperlipidemia: Secondary | ICD-10-CM | POA: Diagnosis not present

## 2017-12-16 ENCOUNTER — Ambulatory Visit (INDEPENDENT_AMBULATORY_CARE_PROVIDER_SITE_OTHER): Payer: Medicare Other

## 2017-12-16 ENCOUNTER — Ambulatory Visit (INDEPENDENT_AMBULATORY_CARE_PROVIDER_SITE_OTHER): Payer: Medicare Other | Admitting: Orthopedic Surgery

## 2017-12-16 DIAGNOSIS — M79671 Pain in right foot: Secondary | ICD-10-CM | POA: Diagnosis not present

## 2017-12-16 DIAGNOSIS — M2021 Hallux rigidus, right foot: Secondary | ICD-10-CM

## 2017-12-24 DIAGNOSIS — M2021 Hallux rigidus, right foot: Secondary | ICD-10-CM | POA: Diagnosis not present

## 2017-12-24 DIAGNOSIS — G8918 Other acute postprocedural pain: Secondary | ICD-10-CM | POA: Diagnosis not present

## 2017-12-30 ENCOUNTER — Encounter (INDEPENDENT_AMBULATORY_CARE_PROVIDER_SITE_OTHER): Payer: Self-pay | Admitting: Orthopedic Surgery

## 2017-12-30 NOTE — Progress Notes (Signed)
   Office Visit Note   Patient: Henry Gaines           Date of Birth: 06/13/1940           MRN: [id number] Visit Date: 12/16/2017              Requested by: No referring provider defined for this encounter. PCP: Lawerance Cruel, MD  Chief Complaint  Patient presents with  . Right Foot - Pain      HPI: Patient is a 77 year old gentleman who presents with pain redness and swelling right great toe MTP joint.  Patient states he cannot wear closed toed shoes due to the bone spurs rubbing on the shoe.  He has a negative history of gout he states he is currently not smoking.  He is status post coronary artery bypass surgery 18 years ago.  Assessment & Plan: Visit Diagnoses:  1. Pain in right foot   2. Hallux rigidus, right foot     Plan: Recommended proceeding with fusion of the great toe MTP joint taking down the bony spurs risks and benefits were discussed including infection nonhealing of the bone need for additional surgery.  Patient states he understands and wishes to proceed at this time.  Follow-Up Instructions: Return in about 1 week (around 12/23/2017).   Ortho Exam  Patient is alert, oriented, no adenopathy, well-dressed, normal affect, normal respiratory effort. Examination patient has a good dorsalis pedis pulse he has essentially no range of motion of the right great toe MTP joint there are large bony spurs that are palpable there is no skin breakdown no redness no cellulitis no drainage no signs of infection or gout.  Imaging: No results found. No images are attached to the encounter.  Labs: Lab Results  Component Value Date   REPTSTATUS 01/25/2010 FINAL 01/24/2010   CULT NO GROWTH 01/24/2010     Lab Results  Component Value Date   ALBUMIN 3.6 01/24/2010    There is no height or weight on file to calculate BMI.  Orders:  Orders Placed This Encounter  Procedures  . XR Foot 2 Views Right   No orders of the defined types were placed in this  encounter.    Procedures: No procedures performed  Clinical Data: No additional findings.  ROS:  All other systems negative, except as noted in the HPI. Review of Systems  Objective: Vital Signs: There were no vitals taken for this visit.  Specialty Comments:  No specialty comments available.  PMFS History: There are no active problems to display for this patient.  History reviewed. No pertinent past medical history.  History reviewed. No pertinent family history.  History reviewed. No pertinent surgical history. Social History   Occupational History  . Not on file  Tobacco Use  . Smoking status: Not on file  Substance and Sexual Activity  . Alcohol use: Not on file  . Drug use: Not on file  . Sexual activity: Not on file

## 2017-12-31 ENCOUNTER — Ambulatory Visit (INDEPENDENT_AMBULATORY_CARE_PROVIDER_SITE_OTHER): Payer: Medicare Other | Admitting: Physician Assistant

## 2017-12-31 ENCOUNTER — Encounter (INDEPENDENT_AMBULATORY_CARE_PROVIDER_SITE_OTHER): Payer: Self-pay | Admitting: Orthopedic Surgery

## 2017-12-31 DIAGNOSIS — M2021 Hallux rigidus, right foot: Secondary | ICD-10-CM

## 2017-12-31 NOTE — Progress Notes (Signed)
   Office Visit Note   Patient: Henry Gaines           Date of Birth: 12-Feb-1941           MRN: [id number] Visit Date: 12/31/2017              Requested by: Lawerance Cruel, [place], [place] [address] PCP: Lawerance Cruel, MD   Assessment & Plan: Visit Diagnoses:  1. Hallux rigidus, right foot     Plan: The patient was counseled to elevate his right foot as much as possible.  He should continue to maintain nonweightbearing and ambulate with crutches or a walker in his postoperative shoe with no weightbearing through the forefoot.  He can use ice to the foot.  He will follow-up in 1 week.  We will plan to harvest the sutures if his edema is improved next visit.  Follow-Up Instructions: Return in about 1 week (around 01/07/2018).   Orders:  No orders of the defined types were placed in this encounter.  No orders of the defined types were placed in this encounter.     Procedures: No procedures performed   Clinical Data: No additional findings.   Subjective: Chief Complaint  Patient presents with  . Right Foot - Routine Post Op    HPI Patient-year-old male who is status post right foot first MTP arthrodesis on 12/25/2017.  He reports that he has had no pain postoperatively.  He does have a postoperative shoe and reports that he ambulated with his crutches for several days nonweightbearing on his right lower extremity but he became sore through his armpit and stopped using the crutches.  He has been trying to ambulate just on the heel of the right foot.  He has been elevating the foot on an ottoman.  Counseled the patient that he should elevate higher than the level of his heart as he still has some moderate edema of the foot.  We also discussed that he should be nonweightbearing and utilize his crutches or his walker to try to limit his weightbearing as much as possible.   Review of Systems   Objective: Vital Signs: There were no vitals taken for  this visit.  Physical Exam Patient is well-nourished well-developed and in no distress.  He ambulates with his postoperative shoe on his right foot with the weight on the heel of the right foot. Ortho Exam The right foot first MTP incision is clean dry and intact.  He does have moderate edema and ecchymosis over the area but no signs of cellulitis nontender to palpation sutures are intact. Specialty Comments:  No specialty comments available.  Imaging: No results found.   PMFS History: There are no active problems to display for this patient.  History reviewed. No pertinent past medical history.  History reviewed. No pertinent family history.  History reviewed. No pertinent surgical history. Social History   Occupational History  . Not on file  Tobacco Use  . Smoking status: Not on file  Substance and Sexual Activity  . Alcohol use: Not on file  . Drug use: Not on file  . Sexual activity: Not on file

## 2018-01-07 ENCOUNTER — Ambulatory Visit (INDEPENDENT_AMBULATORY_CARE_PROVIDER_SITE_OTHER): Payer: Medicare Other | Admitting: Physician Assistant

## 2018-01-07 ENCOUNTER — Encounter (INDEPENDENT_AMBULATORY_CARE_PROVIDER_SITE_OTHER): Payer: Self-pay | Admitting: Physician Assistant

## 2018-01-07 DIAGNOSIS — M2021 Hallux rigidus, right foot: Secondary | ICD-10-CM | POA: Diagnosis not present

## 2018-01-07 NOTE — Progress Notes (Signed)
   Office Visit Note   Patient: Henry Gaines           Date of Birth: Feb 04, 1941           MRN: [id number] Visit Date: 01/07/2018              Requested by: Lawerance Cruel, [place], [place] [address] PCP: Lawerance Cruel, MD   Assessment & Plan: Visit Diagnoses:  1. Hallux rigidus, right foot     Plan: The sutures were harvested this visit.  The patient will continue to Ace wrap the foot to help with edema control.  He can begin weightbearing as tolerated in his postoperative shoe.  He will follow-up in 2 weeks.  Follow-Up Instructions: Return in about 2 weeks (around 01/21/2018).   Orders:  No orders of the defined types were placed in this encounter.  No orders of the defined types were placed in this encounter.     Procedures: No procedures performed   Clinical Data: No additional findings.   Subjective: Chief Complaint  Patient presents with  . Right Foot - Follow-up    HPI The patient is a 77 year old male who is status post right first MTP arthrodesis on 12/25/2017.  He reports only occasional tingling type discomfort in the area but no real pain.  He is ambulating in a postoperative shoe and is not having any pain with ambulation.  His edema is much improved. Review of Systems   Objective: Vital Signs: There were no vitals taken for this visit.  Physical Exam Patient is a well-nourished well-developed male in no apparent distress.  He is well-groomed. Ortho Exam The right foot dorsal incision is healing well and sutures were removed without difficulty.  He has very mild edema.  He is nontender to palpation.  There are no signs of cellulitis.  He is Specialty Comments:  No specialty comments available.  Imaging: No results found.   PMFS History: There are no active problems to display for this patient.  History reviewed. No pertinent past medical history.  History reviewed. No pertinent family history.  History  reviewed. No pertinent surgical history. Social History   Occupational History  . Not on file  Tobacco Use  . Smoking status: Not on file  Substance and Sexual Activity  . Alcohol use: Not on file  . Drug use: Not on file  . Sexual activity: Not on file

## 2018-01-21 ENCOUNTER — Encounter (INDEPENDENT_AMBULATORY_CARE_PROVIDER_SITE_OTHER): Payer: Self-pay | Admitting: Orthopedic Surgery

## 2018-01-21 ENCOUNTER — Ambulatory Visit (INDEPENDENT_AMBULATORY_CARE_PROVIDER_SITE_OTHER): Payer: Medicare Other | Admitting: Physician Assistant

## 2018-01-21 VITALS — Ht 71.0 in | Wt 224.0 lb

## 2018-01-21 DIAGNOSIS — M2021 Hallux rigidus, right foot: Secondary | ICD-10-CM

## 2018-01-22 ENCOUNTER — Encounter (INDEPENDENT_AMBULATORY_CARE_PROVIDER_SITE_OTHER): Payer: Self-pay | Admitting: Physician Assistant

## 2018-01-22 NOTE — Progress Notes (Signed)
   Office Visit Note   Patient: Henry Gaines           Date of Birth: [birth year]           MRN: [id number] Visit Date: 01/21/2018              Requested by: Lawerance Cruel, [place], [place] [address] PCP: Lawerance Cruel, MD  Chief Complaint  Patient presents with  . Right Foot - Follow-up      HPI: Patient is a 77 year old male who is seen for postoperative follow-up following right first MTP arthrodesis on 12/25/2017.  He has no concerns in follow-up.  He reports he is not having any pain.  He has started walking in regular shoes and reports no pain with the transition to regular shoes.  He did buy [employer] with a stiff sole and reports that this is working out well.  His difficulties with postoperative edema are much improved.  He is very pleased with the result.  Assessment & Plan: Visit Diagnoses:  1. Hallux rigidus, right foot     Plan: He is going to continue to progress with his activities to tolerance, weight-bear as tolerated in a stiff soled shoe.Marland Kitchen  He will follow-up here in approximately 4 weeks with x-rays at the next visit or sooner should he have any difficulties in the interim.  Follow-Up Instructions: Return in about 4 weeks (around 02/18/2018).   Ortho Exam  Patient is alert, oriented, no adenopathy, well-dressed, normal affect, normal respiratory effort. The dorsal right great toe incision is healing well.  His edema is much improved.  He is nontender to palpation.  There are no signs of cellulitis.  Imaging: No results found. No images are attached to the encounter.  Labs: Lab Results  Component Value Date   REPTSTATUS 01/25/2010 FINAL 01/24/2010   CULT NO GROWTH 01/24/2010     Lab Results  Component Value Date   ALBUMIN 3.6 01/24/2010    Body mass index is 31.24 kg/m.  Orders:  No orders of the defined types were placed in this encounter.  No orders of the defined types were placed in this encounter.    Procedures: No procedures performed  Clinical Data: No additional findings.  ROS:  All other systems negative, except as noted in the HPI. Review of Systems  Objective: Vital Signs: Ht 5\' 11"  (1.803 m)   Wt 224 lb (101.6 kg)   BMI 31.24 kg/m   Specialty Comments:  No specialty comments available.  PMFS History: There are no active problems to display for this patient.  History reviewed. No pertinent past medical history.  History reviewed. No pertinent family history.  History reviewed. No pertinent surgical history. Social History   Occupational History  . Not on file  Tobacco Use  . Smoking status: Former Research scientist (life sciences)  . Smokeless tobacco: Never Used  . Tobacco comment: 30 years ago  Substance and Sexual Activity  . Alcohol use: Yes    Alcohol/week: 2.0 standard drinks    Types: 2 Glasses of wine per week  . Drug use: Never  . Sexual activity: Not Currently    Partners: Male

## 2018-02-03 DIAGNOSIS — I10 Essential (primary) hypertension: Secondary | ICD-10-CM | POA: Diagnosis not present

## 2018-02-03 DIAGNOSIS — Z125 Encounter for screening for malignant neoplasm of prostate: Secondary | ICD-10-CM | POA: Diagnosis not present

## 2018-02-03 DIAGNOSIS — E7800 Pure hypercholesterolemia, unspecified: Secondary | ICD-10-CM | POA: Diagnosis not present

## 2018-02-03 DIAGNOSIS — Z0000 Encounter for general adult medical examination without abnormal findings: Secondary | ICD-10-CM | POA: Diagnosis not present

## 2018-02-03 DIAGNOSIS — N529 Male erectile dysfunction, unspecified: Secondary | ICD-10-CM | POA: Diagnosis not present

## 2018-02-03 DIAGNOSIS — E669 Obesity, unspecified: Secondary | ICD-10-CM | POA: Diagnosis not present

## 2018-02-03 DIAGNOSIS — N400 Enlarged prostate without lower urinary tract symptoms: Secondary | ICD-10-CM | POA: Diagnosis not present

## 2018-02-03 DIAGNOSIS — Z23 Encounter for immunization: Secondary | ICD-10-CM | POA: Diagnosis not present

## 2018-02-18 ENCOUNTER — Encounter (INDEPENDENT_AMBULATORY_CARE_PROVIDER_SITE_OTHER): Payer: Self-pay | Admitting: Orthopedic Surgery

## 2018-02-18 ENCOUNTER — Ambulatory Visit (INDEPENDENT_AMBULATORY_CARE_PROVIDER_SITE_OTHER): Payer: Medicare Other | Admitting: Orthopedic Surgery

## 2018-02-18 VITALS — Ht 71.0 in | Wt 224.0 lb

## 2018-02-18 DIAGNOSIS — M2021 Hallux rigidus, right foot: Secondary | ICD-10-CM

## 2018-02-18 NOTE — Progress Notes (Signed)
   Office Visit Note   Patient: Henry Gaines           Date of Birth: [birth year]           MRN: [id number] Visit Date: 02/18/2018              Requested by: Lawerance Cruel, [place], [place] [address] PCP: Lawerance Cruel, MD  Chief Complaint  Patient presents with  . Right Foot - Routine Post Op    12/25/17 right GT MTP fusion       HPI: Patient is a 77 year old gentleman who presents status post right great toe MTP fusion.  Patient states he feels great he never has had any pain he states he does not need any follow-up x-rays.  Assessment & Plan: Visit Diagnoses:  1. Hallux rigidus, right foot     Plan: Patient will continue with regular shoewear recommended knee-high compression stockings to help with the swelling.  Follow-Up Instructions: Return if symptoms worsen or fail to improve.   Ortho Exam  Patient is alert, oriented, no adenopathy, well-dressed, normal affect, normal respiratory effort. Examination the incision is well-healed his toe is straight there is no pain with attempted range of motion.  There is a little bit of swelling in the foot and ankle but this is minimal.  Imaging: No results found. No images are attached to the encounter.  Labs: Lab Results  Component Value Date   REPTSTATUS 01/25/2010 FINAL 01/24/2010   CULT NO GROWTH 01/24/2010     Lab Results  Component Value Date   ALBUMIN 3.6 01/24/2010    Body mass index is 31.24 kg/m.  Orders:  No orders of the defined types were placed in this encounter.  No orders of the defined types were placed in this encounter.    Procedures: No procedures performed  Clinical Data: No additional findings.  ROS:  All other systems negative, except as noted in the HPI. Review of Systems  Objective: Vital Signs: Ht 5\' 11"  (1.803 m)   Wt 224 lb (101.6 kg)   BMI 31.24 kg/m   Specialty Comments:  No specialty comments available.  PMFS History: There are no  active problems to display for this patient.  History reviewed. No pertinent past medical history.  History reviewed. No pertinent family history.  History reviewed. No pertinent surgical history. Social History   Occupational History  . Not on file  Tobacco Use  . Smoking status: Former Research scientist (life sciences)  . Smokeless tobacco: Never Used  . Tobacco comment: 30 years ago  Substance and Sexual Activity  . Alcohol use: Yes    Alcohol/week: 2.0 standard drinks    Types: 2 Glasses of wine per week  . Drug use: Never  . Sexual activity: Not Currently    Partners: Male

## 2018-09-23 ENCOUNTER — Other Ambulatory Visit: Payer: Self-pay

## 2018-09-23 ENCOUNTER — Encounter: Payer: Self-pay | Admitting: Orthopedic Surgery

## 2018-09-23 ENCOUNTER — Ambulatory Visit (INDEPENDENT_AMBULATORY_CARE_PROVIDER_SITE_OTHER): Payer: Medicare Other

## 2018-09-23 ENCOUNTER — Ambulatory Visit (INDEPENDENT_AMBULATORY_CARE_PROVIDER_SITE_OTHER): Payer: Medicare Other | Admitting: Orthopedic Surgery

## 2018-09-23 VITALS — Ht 71.0 in | Wt 224.0 lb

## 2018-09-23 DIAGNOSIS — M25512 Pain in left shoulder: Secondary | ICD-10-CM | POA: Diagnosis not present

## 2018-09-23 DIAGNOSIS — M7542 Impingement syndrome of left shoulder: Secondary | ICD-10-CM

## 2018-09-23 MED ORDER — METHYLPREDNISOLONE ACETATE 40 MG/ML IJ SUSP
40.0000 mg | INTRAMUSCULAR | Status: AC | PRN
  Administered 2018-09-23: 17:00:00 40 mg via INTRA_ARTICULAR

## 2018-09-23 MED ORDER — LIDOCAINE HCL 1 % IJ SOLN
5.0000 mL | INTRAMUSCULAR | Status: AC | PRN
  Administered 2018-09-23: 5 mL

## 2018-09-23 NOTE — Progress Notes (Signed)
Office Visit Note   Patient: Henry Gaines           Date of Birth: 12/10/1940           MRN: [id number] Visit Date: 09/23/2018              Requested by: Lawerance Cruel, [place], [place] [address] PCP: Lawerance Cruel, MD  Chief Complaint  Patient presents with  . Left Shoulder - Pain      HPI: Patient is a 78 year old gentleman who states that he was lifting some solar panels with the load shifted and he sprained the left shoulder.  Patient states he has been having pain and difficulty getting his hand overhead since that time.  He states he has to manually lift his arm to get it overhead.  Assessment & Plan: Visit Diagnoses:  1. Left shoulder pain, unspecified chronicity   2. Impingement syndrome of left shoulder     Plan: The shoulder was injected in the subacromial space he tolerated this well.  Discussed that if the injection does not provide sufficient relief the next step would be to obtain an MRI scan to evaluate for a rotator cuff tear with the possibility of arthroscopic debridement.  Follow-Up Instructions: Return if symptoms worsen or fail to improve.   Ortho Exam  Patient is alert, oriented, no adenopathy, well-dressed, normal affect, normal respiratory effort. Examination patient has active abduction and flexion to 70 degrees passively I can get him to 90 degrees.  He does have pain with Neer and Hawkins impingement test the [facility] joint is nontender to palpation the biceps tendon is tender to palpation.  Imaging: Xr Shoulder Left  Result Date: 09/23/2018 2 view radiographs of the left shoulder shows decreased subacromial joint space there are no fractures the lung field is clear glenohumeral joint is congruent there is some mild spurring of the [facility] joint  No images are attached to the encounter.  Labs: Lab Results  Component Value Date   REPTSTATUS 01/25/2010 FINAL 01/24/2010   CULT NO GROWTH 01/24/2010     Lab Results   Component Value Date   ALBUMIN 3.6 01/24/2010    Body mass index is 31.24 kg/m.  Orders:  Orders Placed This Encounter  Procedures  . XR Shoulder Left   No orders of the defined types were placed in this encounter.    Procedures: Large Joint Inj: L subacromial bursa on 09/23/2018 4:30 PM Indications: diagnostic evaluation and pain Details: 22 G 1.5 in needle, posterior approach  Arthrogram: No  Medications: 5 mL lidocaine 1 %; 40 mg methylPREDNISolone acetate 40 MG/ML Outcome: tolerated well, no immediate complications Procedure, treatment alternatives, risks and benefits explained, specific risks discussed. Consent was given by the patient. Immediately prior to procedure a time out was called to verify the correct patient, procedure, equipment, support staff and site/side marked as required. Patient was prepped and draped in the usual sterile fashion.      Clinical Data: No additional findings.  ROS:  All other systems negative, except as noted in the HPI. Review of Systems  Objective: Vital Signs: Ht 5\' 11"  (1.803 m)   Wt 224 lb (101.6 kg)   BMI 31.24 kg/m   Specialty Comments:  No specialty comments available.  PMFS History: There are no active problems to display for this patient.  History reviewed. No pertinent past medical history.  History reviewed. No pertinent family history.  History reviewed. No pertinent surgical history. Social  History   Occupational History  . Not on file  Tobacco Use  . Smoking status: Former Research scientist (life sciences)  . Smokeless tobacco: Never Used  . Tobacco comment: 30 years ago  Substance and Sexual Activity  . Alcohol use: Yes    Alcohol/week: 2.0 standard drinks    Types: 2 Glasses of wine per week  . Drug use: Never  . Sexual activity: Not Currently    Partners: Male

## 2019-01-24 DIAGNOSIS — Z23 Encounter for immunization: Secondary | ICD-10-CM | POA: Diagnosis not present

## 2019-03-12 DIAGNOSIS — I10 Essential (primary) hypertension: Secondary | ICD-10-CM | POA: Diagnosis not present

## 2019-03-12 DIAGNOSIS — E7800 Pure hypercholesterolemia, unspecified: Secondary | ICD-10-CM | POA: Diagnosis not present

## 2019-03-12 DIAGNOSIS — Z125 Encounter for screening for malignant neoplasm of prostate: Secondary | ICD-10-CM | POA: Diagnosis not present

## 2019-03-16 DIAGNOSIS — D485 Neoplasm of uncertain behavior of skin: Secondary | ICD-10-CM | POA: Diagnosis not present

## 2019-03-16 DIAGNOSIS — Z0000 Encounter for general adult medical examination without abnormal findings: Secondary | ICD-10-CM | POA: Diagnosis not present

## 2019-03-16 DIAGNOSIS — H9190 Unspecified hearing loss, unspecified ear: Secondary | ICD-10-CM | POA: Diagnosis not present

## 2019-03-16 DIAGNOSIS — I2510 Atherosclerotic heart disease of native coronary artery without angina pectoris: Secondary | ICD-10-CM | POA: Diagnosis not present

## 2019-03-16 DIAGNOSIS — E7800 Pure hypercholesterolemia, unspecified: Secondary | ICD-10-CM | POA: Diagnosis not present

## 2019-03-16 DIAGNOSIS — L570 Actinic keratosis: Secondary | ICD-10-CM | POA: Diagnosis not present

## 2019-03-16 DIAGNOSIS — N400 Enlarged prostate without lower urinary tract symptoms: Secondary | ICD-10-CM | POA: Diagnosis not present

## 2019-03-16 DIAGNOSIS — Z125 Encounter for screening for malignant neoplasm of prostate: Secondary | ICD-10-CM | POA: Diagnosis not present

## 2019-03-16 DIAGNOSIS — N529 Male erectile dysfunction, unspecified: Secondary | ICD-10-CM | POA: Diagnosis not present

## 2019-03-16 DIAGNOSIS — I10 Essential (primary) hypertension: Secondary | ICD-10-CM | POA: Diagnosis not present

## 2019-03-24 DIAGNOSIS — Z85820 Personal history of malignant melanoma of skin: Secondary | ICD-10-CM | POA: Diagnosis not present

## 2019-03-24 DIAGNOSIS — L723 Sebaceous cyst: Secondary | ICD-10-CM | POA: Diagnosis not present

## 2019-03-24 DIAGNOSIS — L821 Other seborrheic keratosis: Secondary | ICD-10-CM | POA: Diagnosis not present

## 2019-03-24 DIAGNOSIS — C4361 Malignant melanoma of right upper limb, including shoulder: Secondary | ICD-10-CM | POA: Diagnosis not present

## 2019-04-13 DIAGNOSIS — C4361 Malignant melanoma of right upper limb, including shoulder: Secondary | ICD-10-CM | POA: Diagnosis not present

## 2019-05-12 DIAGNOSIS — Z01818 Encounter for other preprocedural examination: Secondary | ICD-10-CM | POA: Diagnosis not present

## 2019-05-12 DIAGNOSIS — C4361 Malignant melanoma of right upper limb, including shoulder: Secondary | ICD-10-CM | POA: Diagnosis not present

## 2019-05-12 DIAGNOSIS — Z20828 Contact with and (suspected) exposure to other viral communicable diseases: Secondary | ICD-10-CM | POA: Diagnosis not present

## 2019-05-12 DIAGNOSIS — I25720 Atherosclerosis of autologous artery coronary artery bypass graft(s) with unstable angina pectoris: Secondary | ICD-10-CM | POA: Diagnosis not present

## 2019-05-13 DIAGNOSIS — C4361 Malignant melanoma of right upper limb, including shoulder: Secondary | ICD-10-CM | POA: Diagnosis not present

## 2019-05-14 DIAGNOSIS — E668 Other obesity: Secondary | ICD-10-CM | POA: Diagnosis not present

## 2019-05-14 DIAGNOSIS — C4361 Malignant melanoma of right upper limb, including shoulder: Secondary | ICD-10-CM | POA: Diagnosis not present

## 2019-05-14 DIAGNOSIS — Z79899 Other long term (current) drug therapy: Secondary | ICD-10-CM | POA: Diagnosis not present

## 2019-05-14 DIAGNOSIS — I10 Essential (primary) hypertension: Secondary | ICD-10-CM | POA: Diagnosis not present

## 2019-05-14 DIAGNOSIS — C773 Secondary and unspecified malignant neoplasm of axilla and upper limb lymph nodes: Secondary | ICD-10-CM | POA: Diagnosis not present

## 2019-05-14 DIAGNOSIS — Z951 Presence of aortocoronary bypass graft: Secondary | ICD-10-CM | POA: Diagnosis not present

## 2019-05-14 DIAGNOSIS — I2510 Atherosclerotic heart disease of native coronary artery without angina pectoris: Secondary | ICD-10-CM | POA: Diagnosis not present

## 2019-05-14 DIAGNOSIS — Z87891 Personal history of nicotine dependence: Secondary | ICD-10-CM | POA: Diagnosis not present

## 2019-05-14 DIAGNOSIS — Z7982 Long term (current) use of aspirin: Secondary | ICD-10-CM | POA: Diagnosis not present

## 2019-06-08 DIAGNOSIS — C4361 Malignant melanoma of right upper limb, including shoulder: Secondary | ICD-10-CM | POA: Diagnosis not present

## 2019-06-08 DIAGNOSIS — C779 Secondary and unspecified malignant neoplasm of lymph node, unspecified: Secondary | ICD-10-CM | POA: Diagnosis not present

## 2019-06-08 DIAGNOSIS — Z9889 Other specified postprocedural states: Secondary | ICD-10-CM | POA: Diagnosis not present

## 2019-06-15 ENCOUNTER — Ambulatory Visit: Payer: Medicare Other | Attending: Internal Medicine

## 2019-06-15 ENCOUNTER — Ambulatory Visit: Payer: Medicare Other

## 2019-06-15 DIAGNOSIS — Z23 Encounter for immunization: Secondary | ICD-10-CM

## 2019-06-15 NOTE — Progress Notes (Signed)
   Covid-19 Vaccination Clinic  Name:  AHMIER CERRITOS    MRN: [id number] DOB: 23-Oct-1940  06/15/2019  Mr. Streight was observed post Covid-19 immunization for 15 minutes without incidence. He was provided with Vaccine Information Sheet and instruction to access the V-Safe system.   Mr. Duble was instructed to call 911 with any severe reactions post vaccine: Marland Kitchen Difficulty breathing  . Swelling of your face and throat  . A fast heartbeat  . A bad rash all over your body  . Dizziness and weakness    Immunizations Administered    Name Date Dose VIS Date Route   Pfizer COVID-19 Vaccine 06/15/2019  5:46 PM 0.3 mL 04/17/2019 Intramuscular   Manufacturer: [place]   Lot: [id number]   [place]: [id number]

## 2019-06-26 DIAGNOSIS — I878 Other specified disorders of veins: Secondary | ICD-10-CM | POA: Diagnosis not present

## 2019-07-01 DIAGNOSIS — D1801 Hemangioma of skin and subcutaneous tissue: Secondary | ICD-10-CM | POA: Diagnosis not present

## 2019-07-01 DIAGNOSIS — L578 Other skin changes due to chronic exposure to nonionizing radiation: Secondary | ICD-10-CM | POA: Diagnosis not present

## 2019-07-01 DIAGNOSIS — Z1283 Encounter for screening for malignant neoplasm of skin: Secondary | ICD-10-CM | POA: Diagnosis not present

## 2019-07-01 DIAGNOSIS — D225 Melanocytic nevi of trunk: Secondary | ICD-10-CM | POA: Diagnosis not present

## 2019-07-01 DIAGNOSIS — L821 Other seborrheic keratosis: Secondary | ICD-10-CM | POA: Diagnosis not present

## 2019-07-01 DIAGNOSIS — D2260 Melanocytic nevi of unspecified upper limb, including shoulder: Secondary | ICD-10-CM | POA: Diagnosis not present

## 2019-07-01 DIAGNOSIS — L814 Other melanin hyperpigmentation: Secondary | ICD-10-CM | POA: Diagnosis not present

## 2019-07-01 DIAGNOSIS — L570 Actinic keratosis: Secondary | ICD-10-CM | POA: Diagnosis not present

## 2019-07-01 DIAGNOSIS — D2270 Melanocytic nevi of unspecified lower limb, including hip: Secondary | ICD-10-CM | POA: Diagnosis not present

## 2019-07-01 DIAGNOSIS — Z85828 Personal history of other malignant neoplasm of skin: Secondary | ICD-10-CM | POA: Diagnosis not present

## 2019-07-01 DIAGNOSIS — D485 Neoplasm of uncertain behavior of skin: Secondary | ICD-10-CM | POA: Diagnosis not present

## 2019-07-11 ENCOUNTER — Ambulatory Visit: Payer: Medicare Other | Attending: Internal Medicine

## 2019-07-11 DIAGNOSIS — Z23 Encounter for immunization: Secondary | ICD-10-CM

## 2019-07-11 NOTE — Progress Notes (Signed)
   Covid-19 Vaccination Clinic  Name:  Henry Gaines    MRN: [id number] DOB: 1940-12-14  07/11/2019  Henry Gaines was observed post Covid-19 immunization for 15 minutes without incident. He was provided with Vaccine Information Sheet and instruction to access the V-Safe system.   Henry Gaines was instructed to call 911 with any severe reactions post vaccine: Marland Kitchen Difficulty breathing  . Swelling of face and throat  . A fast heartbeat  . A bad rash all over body  . Dizziness and weakness   Immunizations Administered    Name Date Dose VIS Date Route   Pfizer COVID-19 Vaccine 07/11/2019 10:25 AM 0.3 mL 04/17/2019 Intramuscular   Manufacturer: [place]   Lot: [id number]   [place]: [id number]

## 2019-07-17 DIAGNOSIS — I10 Essential (primary) hypertension: Secondary | ICD-10-CM | POA: Diagnosis not present

## 2019-07-17 DIAGNOSIS — E7800 Pure hypercholesterolemia, unspecified: Secondary | ICD-10-CM | POA: Diagnosis not present

## 2019-07-17 DIAGNOSIS — I2510 Atherosclerotic heart disease of native coronary artery without angina pectoris: Secondary | ICD-10-CM | POA: Diagnosis not present

## 2019-07-17 DIAGNOSIS — M1990 Unspecified osteoarthritis, unspecified site: Secondary | ICD-10-CM | POA: Diagnosis not present

## 2019-07-17 DIAGNOSIS — N400 Enlarged prostate without lower urinary tract symptoms: Secondary | ICD-10-CM | POA: Diagnosis not present

## 2019-10-01 DIAGNOSIS — C44319 Basal cell carcinoma of skin of other parts of face: Secondary | ICD-10-CM | POA: Diagnosis not present

## 2019-12-07 DIAGNOSIS — C779 Secondary and unspecified malignant neoplasm of lymph node, unspecified: Secondary | ICD-10-CM | POA: Diagnosis not present

## 2019-12-07 DIAGNOSIS — C439 Malignant melanoma of skin, unspecified: Secondary | ICD-10-CM | POA: Diagnosis not present

## 2019-12-07 DIAGNOSIS — C4361 Malignant melanoma of right upper limb, including shoulder: Secondary | ICD-10-CM | POA: Diagnosis not present

## 2019-12-30 DIAGNOSIS — D485 Neoplasm of uncertain behavior of skin: Secondary | ICD-10-CM | POA: Diagnosis not present

## 2019-12-30 DIAGNOSIS — D2260 Melanocytic nevi of unspecified upper limb, including shoulder: Secondary | ICD-10-CM | POA: Diagnosis not present

## 2019-12-30 DIAGNOSIS — L821 Other seborrheic keratosis: Secondary | ICD-10-CM | POA: Diagnosis not present

## 2019-12-30 DIAGNOSIS — D1801 Hemangioma of skin and subcutaneous tissue: Secondary | ICD-10-CM | POA: Diagnosis not present

## 2019-12-30 DIAGNOSIS — L578 Other skin changes due to chronic exposure to nonionizing radiation: Secondary | ICD-10-CM | POA: Diagnosis not present

## 2019-12-30 DIAGNOSIS — L570 Actinic keratosis: Secondary | ICD-10-CM | POA: Diagnosis not present

## 2019-12-30 DIAGNOSIS — Z85828 Personal history of other malignant neoplasm of skin: Secondary | ICD-10-CM | POA: Diagnosis not present

## 2019-12-30 DIAGNOSIS — L814 Other melanin hyperpigmentation: Secondary | ICD-10-CM | POA: Diagnosis not present

## 2019-12-30 DIAGNOSIS — Z1283 Encounter for screening for malignant neoplasm of skin: Secondary | ICD-10-CM | POA: Diagnosis not present

## 2019-12-30 DIAGNOSIS — D2270 Melanocytic nevi of unspecified lower limb, including hip: Secondary | ICD-10-CM | POA: Diagnosis not present

## 2019-12-30 DIAGNOSIS — D225 Melanocytic nevi of trunk: Secondary | ICD-10-CM | POA: Diagnosis not present

## 2020-01-14 DIAGNOSIS — I2510 Atherosclerotic heart disease of native coronary artery without angina pectoris: Secondary | ICD-10-CM | POA: Diagnosis not present

## 2020-01-14 DIAGNOSIS — E7800 Pure hypercholesterolemia, unspecified: Secondary | ICD-10-CM | POA: Diagnosis not present

## 2020-01-14 DIAGNOSIS — M1990 Unspecified osteoarthritis, unspecified site: Secondary | ICD-10-CM | POA: Diagnosis not present

## 2020-01-14 DIAGNOSIS — I10 Essential (primary) hypertension: Secondary | ICD-10-CM | POA: Diagnosis not present

## 2020-01-14 DIAGNOSIS — N400 Enlarged prostate without lower urinary tract symptoms: Secondary | ICD-10-CM | POA: Diagnosis not present

## 2020-03-14 DIAGNOSIS — C4361 Malignant melanoma of right upper limb, including shoulder: Secondary | ICD-10-CM | POA: Diagnosis not present

## 2020-03-14 DIAGNOSIS — Z87891 Personal history of nicotine dependence: Secondary | ICD-10-CM | POA: Diagnosis not present

## 2020-03-14 DIAGNOSIS — Z08 Encounter for follow-up examination after completed treatment for malignant neoplasm: Secondary | ICD-10-CM | POA: Diagnosis not present

## 2020-03-14 DIAGNOSIS — Z85820 Personal history of malignant melanoma of skin: Secondary | ICD-10-CM | POA: Diagnosis not present

## 2020-03-14 DIAGNOSIS — Z85828 Personal history of other malignant neoplasm of skin: Secondary | ICD-10-CM | POA: Diagnosis not present

## 2020-03-14 DIAGNOSIS — R918 Other nonspecific abnormal finding of lung field: Secondary | ICD-10-CM | POA: Diagnosis not present

## 2020-03-28 DIAGNOSIS — R7301 Impaired fasting glucose: Secondary | ICD-10-CM | POA: Diagnosis not present

## 2020-03-28 DIAGNOSIS — N400 Enlarged prostate without lower urinary tract symptoms: Secondary | ICD-10-CM | POA: Diagnosis not present

## 2020-03-28 DIAGNOSIS — N529 Male erectile dysfunction, unspecified: Secondary | ICD-10-CM | POA: Diagnosis not present

## 2020-03-28 DIAGNOSIS — Z0000 Encounter for general adult medical examination without abnormal findings: Secondary | ICD-10-CM | POA: Diagnosis not present

## 2020-03-28 DIAGNOSIS — Z125 Encounter for screening for malignant neoplasm of prostate: Secondary | ICD-10-CM | POA: Diagnosis not present

## 2020-03-28 DIAGNOSIS — E7800 Pure hypercholesterolemia, unspecified: Secondary | ICD-10-CM | POA: Diagnosis not present

## 2020-03-28 DIAGNOSIS — R413 Other amnesia: Secondary | ICD-10-CM | POA: Diagnosis not present

## 2020-03-28 DIAGNOSIS — Z23 Encounter for immunization: Secondary | ICD-10-CM | POA: Diagnosis not present

## 2020-03-28 DIAGNOSIS — Z85820 Personal history of malignant melanoma of skin: Secondary | ICD-10-CM | POA: Diagnosis not present

## 2020-03-28 DIAGNOSIS — I10 Essential (primary) hypertension: Secondary | ICD-10-CM | POA: Diagnosis not present

## 2020-04-22 DIAGNOSIS — I2510 Atherosclerotic heart disease of native coronary artery without angina pectoris: Secondary | ICD-10-CM | POA: Diagnosis not present

## 2020-04-22 DIAGNOSIS — N400 Enlarged prostate without lower urinary tract symptoms: Secondary | ICD-10-CM | POA: Diagnosis not present

## 2020-04-22 DIAGNOSIS — M1990 Unspecified osteoarthritis, unspecified site: Secondary | ICD-10-CM | POA: Diagnosis not present

## 2020-04-22 DIAGNOSIS — E7800 Pure hypercholesterolemia, unspecified: Secondary | ICD-10-CM | POA: Diagnosis not present

## 2020-04-22 DIAGNOSIS — I10 Essential (primary) hypertension: Secondary | ICD-10-CM | POA: Diagnosis not present

## 2020-05-18 ENCOUNTER — Encounter (HOSPITAL_COMMUNITY): Payer: Self-pay | Admitting: Emergency Medicine

## 2020-05-18 ENCOUNTER — Observation Stay (HOSPITAL_COMMUNITY)
Admission: EM | Admit: 2020-05-18 | Discharge: 2020-05-19 | Disposition: A | Discharge status: Home / Self Care | Payer: Medicare Other | Attending: Internal Medicine | Admitting: Internal Medicine

## 2020-05-18 ENCOUNTER — Other Ambulatory Visit: Payer: Self-pay

## 2020-05-18 ENCOUNTER — Emergency Department (HOSPITAL_COMMUNITY): Payer: Medicare Other

## 2020-05-18 DIAGNOSIS — R112 Nausea with vomiting, unspecified: Secondary | ICD-10-CM | POA: Diagnosis not present

## 2020-05-18 DIAGNOSIS — R1111 Vomiting without nausea: Secondary | ICD-10-CM | POA: Diagnosis not present

## 2020-05-18 DIAGNOSIS — R2689 Other abnormalities of gait and mobility: Secondary | ICD-10-CM | POA: Insufficient documentation

## 2020-05-18 DIAGNOSIS — R110 Nausea: Secondary | ICD-10-CM | POA: Diagnosis not present

## 2020-05-18 DIAGNOSIS — R42 Dizziness and giddiness: Secondary | ICD-10-CM | POA: Diagnosis not present

## 2020-05-18 DIAGNOSIS — Z87891 Personal history of nicotine dependence: Secondary | ICD-10-CM | POA: Diagnosis not present

## 2020-05-18 DIAGNOSIS — I2510 Atherosclerotic heart disease of native coronary artery without angina pectoris: Secondary | ICD-10-CM | POA: Diagnosis not present

## 2020-05-18 DIAGNOSIS — R55 Syncope and collapse: Secondary | ICD-10-CM | POA: Diagnosis not present

## 2020-05-18 DIAGNOSIS — I10 Essential (primary) hypertension: Secondary | ICD-10-CM | POA: Insufficient documentation

## 2020-05-18 DIAGNOSIS — Z951 Presence of aortocoronary bypass graft: Secondary | ICD-10-CM | POA: Diagnosis not present

## 2020-05-18 DIAGNOSIS — R001 Bradycardia, unspecified: Secondary | ICD-10-CM | POA: Insufficient documentation

## 2020-05-18 DIAGNOSIS — Z79899 Other long term (current) drug therapy: Secondary | ICD-10-CM | POA: Insufficient documentation

## 2020-05-18 DIAGNOSIS — R21 Rash and other nonspecific skin eruption: Secondary | ICD-10-CM | POA: Diagnosis not present

## 2020-05-18 DIAGNOSIS — Z20822 Contact with and (suspected) exposure to covid-19: Secondary | ICD-10-CM | POA: Insufficient documentation

## 2020-05-18 DIAGNOSIS — I517 Cardiomegaly: Secondary | ICD-10-CM | POA: Diagnosis not present

## 2020-05-18 DIAGNOSIS — I4891 Unspecified atrial fibrillation: Secondary | ICD-10-CM | POA: Diagnosis not present

## 2020-05-18 LAB — CBC
HCT: 36.9 % — ABNORMAL LOW (ref 39.0–52.0)
Hemoglobin: 12.5 g/dL — ABNORMAL LOW (ref 13.0–17.0)
MCH: 32.1 pg (ref 26.0–34.0)
MCHC: 33.9 g/dL (ref 30.0–36.0)
MCV: 94.6 fL (ref 80.0–100.0)
Platelets: 156 K/uL (ref 150–400)
RBC: 3.9 MIL/uL — ABNORMAL LOW (ref 4.22–5.81)
RDW: 13.2 % (ref 11.5–15.5)
WBC: 4.9 K/uL (ref 4.0–10.5)
nRBC: 0 % (ref 0.0–0.2)

## 2020-05-18 LAB — BASIC METABOLIC PANEL WITH GFR
Anion gap: 12 (ref 5–15)
BUN: 16 mg/dL (ref 8–23)
CO2: 25 mmol/L (ref 22–32)
Calcium: 8.4 mg/dL — ABNORMAL LOW (ref 8.9–10.3)
Chloride: 100 mmol/L (ref 98–111)
Creatinine, Ser: 1.03 mg/dL (ref 0.61–1.24)
GFR, Estimated: >60 mL/min
Glucose, Bld: 198 mg/dL — ABNORMAL HIGH (ref 70–99)
Potassium: 3.4 mmol/L — ABNORMAL LOW (ref 3.5–5.1)
Sodium: 137 mmol/L (ref 135–145)

## 2020-05-18 LAB — TROPONIN I (HIGH SENSITIVITY)
Troponin I (High Sensitivity): 5 ng/L
Troponin I (High Sensitivity): 7 ng/L

## 2020-05-18 LAB — HEMOGLOBIN A1C
Hgb A1c MFr Bld: 5.7 % — ABNORMAL HIGH (ref 4.8–5.6)
Mean Plasma Glucose: 116.89 mg/dL

## 2020-05-18 LAB — RESP PANEL BY RT-PCR (FLU A&B, COVID) ARPGX2
Influenza A by PCR: NEGATIVE
Influenza B by PCR: NEGATIVE
SARS Coronavirus 2 by RT PCR: NEGATIVE

## 2020-05-18 LAB — TSH: TSH: 0.923 u[IU]/mL (ref 0.350–4.500)

## 2020-05-18 LAB — MAGNESIUM: Magnesium: 1.7 mg/dL (ref 1.7–2.4)

## 2020-05-18 MED ORDER — SODIUM CHLORIDE 0.9% FLUSH
3.0000 mL | Freq: Two times a day (BID) | INTRAVENOUS | Status: DC
  Administered 2020-05-18 – 2020-05-19 (×2): 3 mL via INTRAVENOUS

## 2020-05-18 MED ORDER — RAMIPRIL 10 MG PO CAPS
10.0000 mg | ORAL_CAPSULE | Freq: Every day | ORAL | Status: DC
Start: 2020-05-19 — End: 2020-05-19
  Administered 2020-05-19: 10 mg via ORAL
  Filled 2020-05-18: qty 1

## 2020-05-18 MED ORDER — DOXAZOSIN MESYLATE 2 MG PO TABS
1.0000 mg | ORAL_TABLET | Freq: Every day | ORAL | Status: DC
  Administered 2020-05-19: 1 mg via ORAL
  Filled 2020-05-18: qty 1

## 2020-05-18 MED ORDER — MAGNESIUM OXIDE 400 (241.3 MG) MG PO TABS
400.0000 mg | ORAL_TABLET | Freq: Two times a day (BID) | ORAL | Status: DC
  Administered 2020-05-18 – 2020-05-19 (×2): 400 mg via ORAL
  Filled 2020-05-18 (×2): qty 1

## 2020-05-18 MED ORDER — ONDANSETRON HCL 4 MG PO TABS: 4.0000 mg | ORAL_TABLET | Freq: Four times a day (QID) | ORAL | Status: DC | PRN

## 2020-05-18 MED ORDER — HYDROCODONE-ACETAMINOPHEN 5-325 MG PO TABS: 1.0000 | ORAL_TABLET | Freq: Four times a day (QID) | ORAL | Status: DC | PRN

## 2020-05-18 MED ORDER — ENOXAPARIN SODIUM 40 MG/0.4ML ~~LOC~~ SOLN
40.0000 mg | SUBCUTANEOUS | Status: DC
  Administered 2020-05-18: 40 mg via SUBCUTANEOUS
  Filled 2020-05-18: qty 0.4

## 2020-05-18 MED ORDER — POTASSIUM CHLORIDE CRYS ER 20 MEQ PO TBCR
40.0000 meq | EXTENDED_RELEASE_TABLET | Freq: Once | ORAL | Status: AC
  Administered 2020-05-18: 40 meq via ORAL
  Filled 2020-05-18: qty 2

## 2020-05-18 MED ORDER — ONDANSETRON HCL 4 MG/2ML IJ SOLN: 4.0000 mg | Freq: Four times a day (QID) | INTRAMUSCULAR | Status: DC | PRN

## 2020-05-18 MED ORDER — SIMVASTATIN 20 MG PO TABS: 40.0000 mg | ORAL_TABLET | Freq: Every day | ORAL | Status: DC

## 2020-05-18 MED ORDER — TRIAMTERENE-HCTZ 37.5-25 MG PO TABS
1.0000 | ORAL_TABLET | Freq: Every day | ORAL | Status: DC
  Administered 2020-05-19: 1 via ORAL
  Filled 2020-05-18: qty 1

## 2020-05-18 NOTE — ED Triage Notes (Signed)
Pt arrives to ED with c/o of a near-syncopal episode this afternoon. Per EMS pt was riding in car and acutely became nauseous, lightheaded, and dizzy. Pt had to get out of car and vomited, fell to ground and "passed out."  EMS found pts HR to be in the 40's.

## 2020-05-18 NOTE — H&P (Signed)
History and Physical    Henry Gaines [id number] DOB: [birth year] DOA: 05/18/2020  PCP: Lawerance Cruel, MD (Confirm with patient/family/NH records and if not entered, this has to be entered at [facility] point of entry) Patient coming from: Home  I have personally briefly reviewed patient's old medical records in [place]  Chief Complaint: Passing out.  HPI: Henry Gaines is a 80 y.o. male with medical history significant of CAD status post CABG ~20 years ago, invasive right arm melanoma status post resection and axillary lymph node dissection, HTN, HLD presented with syncope.  Patient baseline heart rate in the lower 60s on plan upper 50s, is cardiology aware and patient has been taking the same dose of metoprolol 50 mg twice daily.  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 En rout, patient started to complain about feeling of lightheadedness, nausea and vomited once with stomach content and wife noted patient face color turned pale, and then patient pulled over on the roadside and then lost consciousness for a few seconds.  Patient recover consciousness spontaneously, and EMS arrived and found patient heart rate in the low 40s.  ED Course: Initially heart rate was in the 40s, then improved to lower 60s over the course of 5 to 6 hours in the ED stay.  Blood pressure maintain.  EKG on arrival showed sinus bradycardia with first-degree AV block.  Troponin negative x1.  Review of Systems: As per HPI otherwise 14 point review of systems negative.    History reviewed. No pertinent past medical history.  History reviewed. No pertinent surgical history.   reports that he has quit smoking. He has never used smokeless tobacco. He reports current alcohol use of about 2.0 standard drinks of alcohol per week. He reports that he does not use drugs.  No Known  Allergies  History reviewed. No pertinent family history.  Prior to Admission medications   Medication Sig Start Date End Date Taking? Authorizing Provider  doxazosin (CARDURA) 2 MG tablet TK 1 T PO QD 11/15/17   [provider]  HYDROcodone-acetaminophen (NORCO/VICODIN) 5-325 MG tablet TK 1 TO 2 TS PO Q 6 H PRN P 12/24/17   [provider]  metoprolol tartrate (LOPRESSOR) 50 MG tablet TK 1 T PO BID 12/16/17   [provider]  ramipril (ALTACE) 10 MG capsule TK 1 C PO BID 12/16/17   [provider]  simvastatin (ZOCOR) 40 MG tablet TK 1 T PO QPM 12/16/17   [provider]  triamterene-hydrochlorothiazide (MAXZIDE-25) 37.5-25 MG tablet TK 1 T PO QD 12/16/17   [provider]    Physical Exam: Vitals:   05/18/20 1715 05/18/20 1800 05/18/20 1815 05/18/20 1900  BP: (!) 159/79 (!) 154/74 (!) 167/74 (!) 148/79  Pulse: (!) 54 (!) 47 (!) 52 61  Resp: 16 15 13 16   Temp:      TempSrc:      SpO2: 97% 98% 99% 100%  Weight:      Height:        Constitutional: NAD, calm, comfortable Vitals:   05/18/20 1715 05/18/20 1800 05/18/20 1815 05/18/20 1900  BP: (!) 159/79 (!) 154/74 (!) 167/74 (!) 148/79  Pulse: (!) 54 (!) 47 (!) 52 61  Resp: 16 15 13 16   Temp:      TempSrc:      SpO2:  97% 98% 99% 100%  Weight:      Height:       Eyes: PERRL, lids and conjunctivae normal ENMT: Mucous membranes are moist. Posterior pharynx clear of any exudate or lesions.Normal dentition.  Neck: normal, supple, no masses, no thyromegaly Respiratory: clear to auscultation bilaterally, no wheezing, no crackles. Normal respiratory effort. No accessory muscle use.  Cardiovascular: Regular rate and rhythm, no murmurs / rubs / gallops. No extremity edema. 2+ pedal pulses. No carotid bruits.  Abdomen: no tenderness, no masses palpated. No hepatosplenomegaly. Bowel sounds positive.  Musculoskeletal: no clubbing / cyanosis. No joint deformity upper and lower extremities.  Good ROM, no contractures. Normal muscle tone.  Skin: no rashes, lesions, ulcers. No induration Neurologic: CN 2-12 grossly intact. Sensation intact, DTR normal. Strength 5/5 in all 4.  Psychiatric: Normal judgment and insight. Alert and oriented x 3. Normal mood.     Labs on Admission: I have personally reviewed following labs and imaging studies  CBC: Recent Labs  Lab 05/18/20 1211  WBC 4.9  HGB 12.5*  HCT 36.9*  MCV 94.6  PLT 983   Basic Metabolic Panel: Recent Labs  Lab 05/18/20 1211 05/18/20 1604  NA 137  --   K 3.4*  --   CL 100  --   CO2 25  --   GLUCOSE 198*  --   BUN 16  --   CREATININE 1.03  --   CALCIUM 8.4*  --   MG  --  1.7   GFR: Estimated Creatinine Clearance: 68.3 mL/min (by C-G formula based on SCr of 1.03 mg/dL). Liver Function Tests: No results for input(s): AST, ALT, ALKPHOS, BILITOT, PROT, ALBUMIN in the last 168 hours. No results for input(s): LIPASE, AMYLASE in the last 168 hours. No results for input(s): AMMONIA in the last 168 hours. Coagulation Profile: No results for input(s): INR, PROTIME in the last 168 hours. Cardiac Enzymes: No results for input(s): CKTOTAL, CKMB, CKMBINDEX, TROPONINI in the last 168 hours. BNP (last 3 results) No results for input(s): PROBNP in the last 8760 hours. HbA1C: No results for input(s): HGBA1C in the last 72 hours. CBG: No results for input(s): GLUCAP in the last 168 hours. Lipid Profile: No results for input(s): CHOL, HDL, LDLCALC, TRIG, CHOLHDL, LDLDIRECT in the last 72 hours. Thyroid Function Tests: No results for input(s): TSH, T4TOTAL, FREET4, T3FREE, THYROIDAB in the last 72 hours. Anemia Panel: No results for input(s): VITAMINB12, FOLATE, FERRITIN, TIBC, IRON, RETICCTPCT in the last 72 hours. Urine analysis:    Component Value Date/Time   COLORURINE AMBER BIOCHEMICALS MAY BE AFFECTED BY COLOR (A) 01/24/2010 1905   APPEARANCEUR CLEAR 01/24/2010 1905   LABSPEC 1.028 01/24/2010 1905   PHURINE  5.5 01/24/2010 1905   GLUCOSEU NEGATIVE 01/24/2010 1905   HGBUR LARGE (A) 01/24/2010 1905   BILIRUBINUR NEGATIVE 01/24/2010 1905   KETONESUR NEGATIVE 01/24/2010 1905   PROTEINUR 30 (A) 01/24/2010 1905   UROBILINOGEN 1.0 01/24/2010 1905   NITRITE NEGATIVE 01/24/2010 1905   LEUKOCYTESUR TRACE (A) 01/24/2010 1905    Radiological Exams on Admission: DG Chest Port 1 View  Result Date: 05/18/2020 CLINICAL DATA:  80 year old male with syncope. EXAM: PORTABLE CHEST 1 VIEW COMPARISON:  Chest radiograph dated 02/14/2009. FINDINGS: Mild cardiomegaly. No focal consolidation, pleural effusion or pneumothorax. Median sternotomy wires. Atherosclerotic calcification of the aorta. No acute osseous pathology. IMPRESSION: No acute cardiopulmonary process. Electronically Signed   By: Anner Crete M.D.   On: 05/18/2020 16:21    EKG: Independently reviewed.  Sinus rhythm, fascicular AV block  Assessment/Plan Active Problems:   Syncope, vasovagal   Syncope  (please populate well all problems here in Problem List. (For example, if patient is on BP meds at home and you resume or decide to hold them, it is a problem that needs to be her. Same for CAD, COPD, HLD and so on)  Syncope -From symptomatic bradycardia, hold beta-blocker for now. -Probably restart low-dose of beta-blocker tomorrow if heart rate continue to improve. -Orthostatic vital signs -Consider outpatient echocardiogram and cardiology follow-up.  Hypokalemia -P.o. replacement\ -Recheck in the morning  Hyperglycemia -No history of diabetes -Check A1c and repeat BMP tomorrow  Hypomagnesemia -P.o. replacement  HTN -Hold metoprolol as above -Continue ramipril and HCTZ plus triamterene  HLD -Continue simvastatin  Invasive melanoma - PET scan negative x2 last year -Outpatient dermatology follow-up  DVT prophylaxis: Lovenox Code Status: Full code  family Communication: Wife at bedside Disposition Plan: Expect discharge in a.m.   consults called: None Admission status: Tele Obs   Lequita Halt MD [facility] Pager [phone]  05/18/2020, 7:24 PM

## 2020-05-18 NOTE — ED Provider Notes (Signed)
[facility] EMERGENCY DEPARTMENT Provider Note   CSN: [id number] Arrival date & time: 05/18/20  1149     History Chief Complaint  Patient presents with  . Near Syncope  . Nausea    Henry Gaines is a 80 y.o. male.  HPI   80 year old male with past medical history of CAD status post CABG, HTN, HLD presents to the emergency department for evaluation of possible near syncope.  Patient states he woke up this morning in his usual state of health.  He walked out to his car, got in and started to drive to go to work.  All of a sudden he became extremely nauseated, lightheaded.  He says he got out of the car, vomited on the yard and then had to lay down.  He is not sure whether he lost consciousness or not, his wife came outside, found him and called EMS.  EMS reports that his heart rate was in the low 40s on arrival with a stable blood pressure.  Patient had started to come around and was alert and oriented on their arrival.  Patient denies having any chest pain or difficulty breathing at this time.  He is never had an episode of this before.  He has had no abdominal pain, diarrhea or other symptoms.  No swelling of his lower extremities or cough.  History reviewed. No pertinent past medical history.  There are no problems to display for this patient.   History reviewed. No pertinent surgical history.     History reviewed. No pertinent family history.  Social History   Tobacco Use  . Smoking status: Former Research scientist (life sciences)  . Smokeless tobacco: Never Used  . Tobacco comment: 30 years ago  Substance Use Topics  . Alcohol use: Yes    Alcohol/week: 2.0 standard drinks    Types: 2 Glasses of wine per week  . Drug use: Never    Home Medications Prior to Admission medications   Medication Sig Start Date End Date Taking? Authorizing Provider  doxazosin (CARDURA) 2 MG tablet TK 1 T PO QD 11/15/17   [provider]  HYDROcodone-acetaminophen (NORCO/VICODIN) 5-325 MG  tablet TK 1 TO 2 TS PO Q 6 H PRN P 12/24/17   [provider]  metoprolol tartrate (LOPRESSOR) 50 MG tablet TK 1 T PO BID 12/16/17   [provider]  ramipril (ALTACE) 10 MG capsule TK 1 C PO BID 12/16/17   [provider]  simvastatin (ZOCOR) 40 MG tablet TK 1 T PO QPM 12/16/17   [provider]  triamterene-hydrochlorothiazide (MAXZIDE-25) 37.5-25 MG tablet TK 1 T PO QD 12/16/17   [provider]    Allergies    Patient has no known allergies.  Review of Systems   Review of Systems  Constitutional: Negative for chills and fever.  HENT: Negative for congestion.   Eyes: Negative for visual disturbance.  Respiratory: Negative for shortness of breath.   Cardiovascular: Negative for chest pain.  Gastrointestinal: Positive for nausea and vomiting. Negative for abdominal pain and diarrhea.  Genitourinary: Negative for dysuria.  Skin: Negative for rash.  Neurological: Positive for light-headedness. Negative for headaches.       + Near syncope    Physical Exam Updated Vital Signs BP (!) 154/72   Pulse 63   Temp 98.6 F (37 C) (Oral)   Resp 12   Ht 5\' 11"  (1.803 m)   Wt 101.6 kg   SpO2 100%   BMI 31.24 kg/m  Physical Exam Vitals and nursing note reviewed.  Constitutional:      Appearance: Normal appearance.  HENT:     Head: Normocephalic.     Mouth/Throat:     Mouth: Mucous membranes are moist.  Cardiovascular:     Rate and Rhythm: Bradycardia present.     Comments: Median sternotomy scar Pulmonary:     Effort: Pulmonary effort is normal. No respiratory distress.  Abdominal:     Palpations: Abdomen is soft.     Tenderness: There is no abdominal tenderness.  Skin:    General: Skin is warm.  Neurological:     Mental Status: He is alert and oriented to person, place, and time. Mental status is at baseline.  Psychiatric:        Mood and Affect: Mood normal.     ED Results / Procedures / Treatments   Labs (all labs ordered  are listed, but only abnormal results are displayed) Labs Reviewed  BASIC METABOLIC PANEL - Abnormal; Notable for the following components:      Result Value   Potassium 3.4 (*)    Glucose, Bld 198 (*)    Calcium 8.4 (*)    All other components within normal limits  CBC - Abnormal; Notable for the following components:   RBC 3.90 (*)    Hemoglobin 12.5 (*)    HCT 36.9 (*)    All other components within normal limits  URINALYSIS, ROUTINE W REFLEX MICROSCOPIC  MAGNESIUM  CBG MONITORING, ED  TROPONIN I (HIGH SENSITIVITY)    EKG EKG Interpretation  Date/Time:  Wednesday May 18 2020 11:57:49 EST Ventricular Rate:  42 PR Interval:  286 QRS Duration: 108 QT Interval:  560 QTC Calculation: 467 R Axis:   -11 Text Interpretation: Marked sinus bradycardia with 1st degree A-V block Septal infarct , age undetermined Abnormal ECG Bradycardia, 1st degree AV block, no STEMI Confirmed by Lavenia Atlas [phone]) on 05/18/2020 4:02:51 PM   Radiology DG Chest Port 1 View  Result Date: 05/18/2020 CLINICAL DATA:  80 year old male with syncope. EXAM: PORTABLE CHEST 1 VIEW COMPARISON:  Chest radiograph dated 02/14/2009. FINDINGS: Mild cardiomegaly. No focal consolidation, pleural effusion or pneumothorax. Median sternotomy wires. Atherosclerotic calcification of the aorta. No acute osseous pathology. IMPRESSION: No acute cardiopulmonary process. Electronically Signed   By: Anner Crete M.D.   On: 05/18/2020 16:21    Procedures Procedures (including critical care time)  Medications Ordered in ED Medications - No data to display  ED Course  I have reviewed the triage vital signs and the nursing notes.  Pertinent labs & imaging results that were available during my care of the patient were reviewed by me and considered in my medical decision making (see chart for details).    MDM Rules/Calculators/A&P                          80 year old male presents to the emergency department with  nausea/vomiting, lightheadedness and reported near syncope.  On arrival patient is noted to be bradycardic with his heart rate in the 40s with a first-degree AV block.  This is a new finding for the patient.  On my evaluation the patient's heart rate ranged from 40-60 while he was at rest and conversational.  He appears in no distress.  Blood work is reassuring, troponin is negative, magnesium is normal.  Patient is on metoprolol, he says no recent changes or adjustments to his medications, he has been compliant at his current  dose.  Appears patient had symptomatic bradycardia with near syncope/syncope.  Given his age, comorbidities and story he requires admission for further treatment and care. Patient agrees with admission plan, offers no new complaints and is stable/unchanged at time of admit.  Final Clinical Impression(s) / ED Diagnoses Final diagnoses:  None    Rx / DC Orders ED Discharge Orders    None       Lorelle Gibbs, DO 05/18/20 1900

## 2020-05-19 DIAGNOSIS — R55 Syncope and collapse: Secondary | ICD-10-CM | POA: Diagnosis not present

## 2020-05-19 DIAGNOSIS — Z20822 Contact with and (suspected) exposure to covid-19: Secondary | ICD-10-CM | POA: Diagnosis not present

## 2020-05-19 DIAGNOSIS — I2510 Atherosclerotic heart disease of native coronary artery without angina pectoris: Secondary | ICD-10-CM | POA: Diagnosis not present

## 2020-05-19 DIAGNOSIS — Z951 Presence of aortocoronary bypass graft: Secondary | ICD-10-CM | POA: Diagnosis not present

## 2020-05-19 DIAGNOSIS — Z79899 Other long term (current) drug therapy: Secondary | ICD-10-CM | POA: Diagnosis not present

## 2020-05-19 DIAGNOSIS — Z87891 Personal history of nicotine dependence: Secondary | ICD-10-CM | POA: Diagnosis not present

## 2020-05-19 DIAGNOSIS — I10 Essential (primary) hypertension: Secondary | ICD-10-CM | POA: Diagnosis not present

## 2020-05-19 LAB — BASIC METABOLIC PANEL
Anion gap: 10 (ref 5–15)
BUN: 13 mg/dL (ref 8–23)
CO2: 26 mmol/L (ref 22–32)
Calcium: 8.6 mg/dL — ABNORMAL LOW (ref 8.9–10.3)
Chloride: 102 mmol/L (ref 98–111)
Creatinine, Ser: 0.85 mg/dL (ref 0.61–1.24)
GFR, Estimated: >60 mL/min (ref >60)
Glucose, Bld: 103 mg/dL — ABNORMAL HIGH (ref 70–99)
Potassium: 3.7 mmol/L (ref 3.5–5.1)
Sodium: 138 mmol/L (ref 135–145)

## 2020-05-19 LAB — GLUCOSE, CAPILLARY: Glucose-Capillary: 96 mg/dL (ref 70–99)

## 2020-05-19 MED ORDER — METOPROLOL TARTRATE 25 MG PO TABS: 25.0000 mg | ORAL_TABLET | Freq: Two times a day (BID) | ORAL | 0 refills | Status: AC

## 2020-05-19 MED ORDER — METOPROLOL TARTRATE 25 MG PO TABS
25.0000 mg | ORAL_TABLET | Freq: Two times a day (BID) | ORAL | Status: DC
  Administered 2020-05-19: 25 mg via ORAL
  Filled 2020-05-19: qty 1

## 2020-05-19 MED ORDER — METOPROLOL TARTRATE 25 MG PO TABS: 25.0000 mg | ORAL_TABLET | Freq: Two times a day (BID) | ORAL | 0 refills | Status: DC

## 2020-05-19 NOTE — Progress Notes (Signed)
Discharge teaching complete. Meds, diet, activity and follow up appointments reviewed and all questions answered. Copy of instructions given to patient and prescription sent to pharmacy. Wife present during teaching. Patient discharged home via wheelchair with wife.

## 2020-05-19 NOTE — Discharge Summary (Signed)
Physician Discharge Summary  Henry Gaines:[id number] DOB: 1940-09-19 DOA: 05/18/2020  PCP: Lawerance Cruel, MD  Admit date: 05/18/2020 Discharge date: 05/19/2020  Admitted From: home Discharge disposition: home   Recommendations for Outpatient Follow-Up:   1. Beta blocker cut in half due to bradycardia: Heart rate response well when patient up walking going up to 107 and recovering nicely 2. Consider Holter monitor but episode sounds more vasovagal with nausea/vomiting after consuming breakfast   Discharge Diagnosis:   Active Problems:   Syncope, vasovagal   Syncope    Discharge Condition: Improved.  Diet recommendation: Low sodium, heart healthy  Wound care: None.  Code status: Full.   History of Present Illness:   Henry Gaines is a 80 y.o. male with medical history significant of CAD status post CABG ~20 years ago, invasive right arm melanoma status post resection and axillary lymph node dissection, HTN, HLD presented with syncope.  Patient baseline heart rate in the lower 60s on plan upper 50s, is cardiology aware and patient has been taking the same dose of metoprolol 50 mg twice daily.  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 En rout, patient started to complain about feeling of lightheadedness, nausea and vomited once with stomach content and wife noted patient face color turned pale, and then patient pulled over on the roadside and then lost consciousness for a few seconds.  Patient recover consciousness spontaneously, and EMS arrived and found patient heart rate in the low 40s.  ED Course: Initially heart rate was in the 40s, then improved to lower 60s over the course of 5 to 6 hours in the ED stay.  Blood pressure maintain.  EKG on arrival showed sinus bradycardia with first-degree AV block.  Troponin negative  x1.   Hospital Course by Problem:   Syncope -Most likely vasovagal but possibly due to symptomatic bradycardia although patient has been on a beta-blocker for a long time without issue. -Resume half a dose of beta-blocker -Consider outpatient Holter monitor if any cardiac concerns  Hypokalemia -Replace Outpatient follow-up  Hypomagnesemia -P.o. replacement  HTN -Resume metoprolol at a lower dose -Continue ramipril and HCTZ plus triamterene  HLD -Continue simvastatin  Invasive melanoma - PET scan negative x2 last year -Outpatient dermatology follow-up    Medical Consultants:      Discharge Exam:   Vitals:   05/19/20 0529 05/19/20 1148  BP: (!) 158/76 (!) 158/67  Pulse: 61 60  Resp: 14 16  Temp: 97.8 F (36.6 C) 98 F (36.7 C)  SpO2: 99% 98%   Vitals:   05/18/20 2200 05/18/20 2218 05/19/20 0529 05/19/20 1148  BP: (!) 148/79 (!) 159/83 (!) 158/76 (!) 158/67  Pulse: (!) 56 72 61 60  Resp: 15 16 14 16   Temp: 98.4 F (36.9 C) 98 F (36.7 C) 97.8 F (36.6 C) 98 F (36.7 C)  TempSrc: Oral Oral Oral Oral  SpO2: 97% 100% 99% 98%  Weight:  101.5 kg 100.3 kg   Height:  5\' 11"  (1.803 m)      General exam: Appears calm and comfortable.   The results of significant diagnostics from this hospitalization (including imaging, microbiology, ancillary and laboratory) are listed below for reference.     Procedures and Diagnostic Studies:   DG Chest Port 1 View  Result Date: 05/18/2020 CLINICAL DATA:  80 year old male with syncope. EXAM: PORTABLE CHEST 1 VIEW COMPARISON:  Chest radiograph dated 02/14/2009. FINDINGS: Mild cardiomegaly. No focal consolidation, pleural effusion or pneumothorax. Median sternotomy wires. Atherosclerotic calcification of the aorta. No acute osseous pathology. IMPRESSION: No acute cardiopulmonary process. Electronically Signed   By: Anner Crete M.D.   On: 05/18/2020 16:21     Labs:   Basic Metabolic Panel: Recent Labs  Lab  05/18/20 1211 05/18/20 1604 05/19/20 0337  NA 137  --  138  K 3.4*  --  3.7  CL 100  --  102  CO2 25  --  26  GLUCOSE 198*  --  103*  BUN 16  --  13  CREATININE 1.03  --  0.85  CALCIUM 8.4*  --  8.6*  MG  --  1.7  --    GFR Estimated Creatinine Clearance: 82.2 mL/min (by C-G formula based on SCr of 0.85 mg/dL). Liver Function Tests: No results for input(s): AST, ALT, ALKPHOS, BILITOT, PROT, ALBUMIN in the last 168 hours. No results for input(s): LIPASE, AMYLASE in the last 168 hours. No results for input(s): AMMONIA in the last 168 hours. Coagulation profile No results for input(s): INR, PROTIME in the last 168 hours.  CBC: Recent Labs  Lab 05/18/20 1211  WBC 4.9  HGB 12.5*  HCT 36.9*  MCV 94.6  PLT 156   Cardiac Enzymes: No results for input(s): CKTOTAL, CKMB, CKMBINDEX, TROPONINI in the last 168 hours. BNP: Invalid input(s): POCBNP CBG: Recent Labs  Lab 05/19/20 0558  GLUCAP 96   D-Dimer No results for input(s): DDIMER in the last 72 hours. Hgb A1c Recent Labs    05/18/20 2248  HGBA1C 5.7*   Lipid Profile No results for input(s): CHOL, HDL, LDLCALC, TRIG, CHOLHDL, LDLDIRECT in the last 72 hours. Thyroid function studies Recent Labs    05/18/20 2248  TSH 0.923   Anemia work up No results for input(s): VITAMINB12, FOLATE, FERRITIN, TIBC, IRON, RETICCTPCT in the last 72 hours. Microbiology Recent Results (from the past 240 hour(s))  Resp Panel by RT-PCR (Flu A&B, Covid) Nasopharyngeal Swab     Status: None   Collection Time: 05/18/20  9:30 PM   Specimen: Nasopharyngeal Swab; Nasopharyngeal(NP) swabs in vial transport medium  Result Value Ref Range Status   SARS Coronavirus 2 by RT PCR NEGATIVE NEGATIVE Final    Comment: (NOTE) SARS-CoV-2 target nucleic acids are NOT DETECTED.  The SARS-CoV-2 RNA is generally detectable in upper respiratory specimens during the acute phase of infection. The lowest concentration of SARS-CoV-2 viral copies this  assay can detect is 138 copies/mL. A negative result does not preclude SARS-Cov-2 infection and should not be used as the sole basis for treatment or other patient management decisions. A negative result may occur with  improper specimen collection/handling, submission of specimen other than nasopharyngeal swab, presence of viral mutation(s) within the areas targeted by this assay, and inadequate number of viral copies(<138 copies/mL). A negative result must be combined with clinical observations, patient history, and epidemiological information. The expected result is Negative.  Fact Sheet for Patients:  EntrepreneurPulse.com.au  Fact Sheet for Healthcare Providers:  IncredibleEmployment.be  This test is no t yet approved or cleared by the [place] FDA and  has been authorized for detection and/or diagnosis of SARS-CoV-2 by FDA under an Emergency Use Authorization (EUA). This EUA will remain  in effect (meaning this test can be used) for the duration of the COVID-19 declaration under Section 564(b)(1) of the Act, 21  U.S.C.section 360bbb-3(b)(1), unless the authorization is terminated  or revoked sooner.       Influenza A by PCR NEGATIVE NEGATIVE Final   Influenza B by PCR NEGATIVE NEGATIVE Final    Comment: (NOTE) The Xpert Xpress SARS-CoV-2/FLU/RSV plus assay is intended as an aid in the diagnosis of influenza from Nasopharyngeal swab specimens and should not be used as a sole basis for treatment. Nasal washings and aspirates are unacceptable for Xpert Xpress SARS-CoV-2/FLU/RSV testing.  Fact Sheet for Patients: EntrepreneurPulse.com.au  Fact Sheet for Healthcare Providers: IncredibleEmployment.be  This test is not yet approved or cleared by the [place] FDA and has been authorized for detection and/or diagnosis of SARS-CoV-2 by FDA under an Emergency Use Authorization (EUA). This EUA will  remain in effect (meaning this test can be used) for the duration of the COVID-19 declaration under Section 564(b)(1) of the Act, 21 U.S.C. section 360bbb-3(b)(1), unless the authorization is terminated or revoked.  Performed at [facility], [place] [address] [place], [place] [address]      Discharge Instructions:   Discharge Instructions    Diet - low sodium heart healthy   Complete by: As directed    Discharge instructions   Complete by: As directed    BP and HR check in 1 week   Increase activity slowly   Complete by: As directed      Allergies as of 05/19/2020   No Known Allergies     Medication List    TAKE these medications   aspirin EC 81 MG tablet Take 81 mg by mouth daily. Swallow whole.   CoQ10 100 MG Caps Take by mouth.   DAIRY DIGESTIVE PO Take 1 tablet by mouth daily.   doxazosin 2 MG tablet Commonly known as: CARDURA Take 2 mg by mouth every evening.   folic acid A999333 MCG tablet Commonly known as: FOLVITE Take 400 mcg by mouth daily.   JOINT HEALTH PO Take 1 tablet by mouth daily.   magnesium gluconate 500 MG tablet Commonly known as: MAGONATE Take 500 mg by mouth every evening.   metoprolol tartrate 25 MG tablet Commonly known as: LOPRESSOR Take 1 tablet (25 mg total) by mouth 2 (two) times daily. What changed:   medication strength  how much to take   niacin 250 MG CR capsule Take 250 mg by mouth 2 (two) times daily with a meal.   Omega-3 Krill Oil 500 MG Caps Take 1 capsule by mouth daily.   ramipril 10 MG capsule Commonly known as: ALTACE Take 10 mg by mouth daily.   triamterene-hydrochlorothiazide 37.5-25 MG tablet Commonly known as: MAXZIDE-25 Take 1 tablet by mouth daily.   Vitamin B-12 2500 MCG Subl Place 1 tablet under the tongue daily.   Vitamin C 500 MG Caps Take 1 capsule by mouth daily.   Vitamin D-3 125 MCG (5000 UT) Tabs Take 1 tablet by mouth daily.   ZINC PO Take 1 tablet by mouth daily.        Follow-up Information    Lawerance Cruel, MD Follow up in 1 week(s).   Specialty: Family Medicine Contact information: [address] [place] [address] [phone]                Time coordinating discharge: 35 minutes  Signed:  Geradine Girt DO  [facility] 05/19/2020, 1:19 PM

## 2020-05-19 NOTE — Evaluation (Signed)
Physical Therapy Evaluation Patient Details Name: Henry Gaines MRN: [id number] DOB: Sep 30, 1940 Today's Date: 05/19/2020   History of Present Illness  80yo male presenting with syncope, HR found to be in the low 40s with EMS. HR improved to 50-60BPM while in the ED, and EKG showed first degree AV block. PMH CAD s/p CABG, HTN, HLD  Clinical Impression   Patient received in bed, very HOH and extremely talkative but able to be redirected, pleasant and cooperative. Able to mobilize at an independent to Modified independent basis without difficulty today. HR ranged from 90-104BPM with activity today. Orthostatics rechecked- hypertensive but WNL. Left up in recliner with all needs met, RN aware of patient status. Skilled PT services are not indicated at this time as he is independent with mobility and at his baseline- signing off, thank you for the referral!     Follow Up Recommendations No PT follow up    Equipment Recommendations  None recommended by PT    Recommendations for Other Services       Precautions / Restrictions Precautions Precautions: None Restrictions Weight Bearing Restrictions: No      Mobility  Bed Mobility Overal bed mobility: Modified Independent             General bed mobility comments: HOB moderately elevated    Transfers Overall transfer level: Modified independent Equipment used: None             General transfer comment: no physical assist given, safe and steady  Ambulation/Gait Ambulation/Gait assistance: Independent Gait Distance (Feet): 200 Feet Assistive device: None Gait Pattern/deviations: WFL(Within Functional Limits);Step-through pattern;Wide base of support;Trunk flexed Gait velocity: decreased   General Gait Details: gait safe and steady, wide BOS but suspect this is his baseline. No significant balance deficits noted.  Stairs            Wheelchair Mobility    Modified Rankin (Stroke Patients Only)        Balance Overall balance assessment: No apparent balance deficits (not formally assessed)                                           Pertinent Vitals/Pain Pain Assessment: No/denies pain    Home Living Family/patient expects to be discharged to:: Private residence Living Arrangements: Spouse/significant other Available Help at Discharge: Family;Available 24 hours/day Type of Home: House Home Access: Stairs to enter Entrance Stairs-Rails: Can reach both Entrance Stairs-Number of Steps: 2-3 Home Layout: One level Home Equipment: None Additional Comments: doesn't use it but has a cane and a walker    Prior Function Level of Independence: Independent         Comments: 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        Extremity/Trunk Assessment   Upper Extremity Assessment Upper Extremity Assessment: Overall WFL for tasks assessed    Lower Extremity Assessment Lower Extremity Assessment: Overall WFL for tasks assessed    Cervical / Trunk Assessment Cervical / Trunk Assessment: Normal  Communication   Communication: HOH  Cognition Arousal/Alertness: Awake/alert Behavior During Therapy: WFL for tasks assessed/performed Overall Cognitive Status: Within Functional Limits for tasks assessed                                 General Comments: but perseverating on "  my heart rate was in the 40s and no one can find a trace of evidence about this!!!". Very HOH which causes him to give odd answers sometimes but very cooperative and pleasant overall.      General Comments      Exercises     Assessment/Plan    PT Assessment Patent does not need any further PT services  PT Problem List         PT Treatment Interventions      PT Goals (Current goals can be found in the Care Plan section)  Acute Rehab PT Goals Patient Stated Goal: get some more bananas from the kitchen, go home PT Goal Formulation: With  patient Time For Goal Achievement: 06/02/20 Potential to Achieve Goals: Good    Frequency     Barriers to discharge        Co-evaluation               AM-PAC PT "6 Clicks" Mobility  Outcome Measure Help needed turning from your back to your side while in a flat bed without using bedrails?: None Help needed moving from lying on your back to sitting on the side of a flat bed without using bedrails?: None Help needed moving to and from a bed to a chair (including a wheelchair)?: None Help needed standing up from a chair using your arms (e.g., wheelchair or bedside chair)?: None Help needed to walk in hospital room?: None Help needed climbing 3-5 steps with a railing? : None 6 Click Score: 24    End of Session   Activity Tolerance: Patient tolerated treatment well Patient left: in chair;with call bell/phone within reach Nurse Communication: Mobility status;Other (comment) (orthostatics and HR during activity) PT Visit Diagnosis: Other abnormalities of gait and mobility (R26.89)    Time: 5277-8242 PT Time Calculation (min) (ACUTE ONLY): 32 min   Charges:   PT Evaluation $PT Eval Low Complexity: 1 Low PT Treatments $Gait Training: 8-22 mins        Windell Norfolk, DPT, PN1   Supplemental Physical Therapist [place]    Pager [phone] Acute Rehab Office [phone]

## 2020-06-03 DIAGNOSIS — Z23 Encounter for immunization: Secondary | ICD-10-CM | POA: Diagnosis not present

## 2020-06-03 DIAGNOSIS — R059 Cough, unspecified: Secondary | ICD-10-CM | POA: Diagnosis not present

## 2020-06-03 DIAGNOSIS — Z09 Encounter for follow-up examination after completed treatment for conditions other than malignant neoplasm: Secondary | ICD-10-CM | POA: Diagnosis not present

## 2020-06-03 DIAGNOSIS — E876 Hypokalemia: Secondary | ICD-10-CM | POA: Diagnosis not present

## 2020-06-03 DIAGNOSIS — R001 Bradycardia, unspecified: Secondary | ICD-10-CM | POA: Diagnosis not present

## 2020-06-13 DIAGNOSIS — C439 Malignant melanoma of skin, unspecified: Secondary | ICD-10-CM | POA: Diagnosis not present

## 2020-06-13 DIAGNOSIS — C4361 Malignant melanoma of right upper limb, including shoulder: Secondary | ICD-10-CM | POA: Diagnosis not present

## 2020-06-13 DIAGNOSIS — H9193 Unspecified hearing loss, bilateral: Secondary | ICD-10-CM | POA: Diagnosis not present

## 2020-06-13 DIAGNOSIS — C779 Secondary and unspecified malignant neoplasm of lymph node, unspecified: Secondary | ICD-10-CM | POA: Diagnosis not present

## 2020-06-16 DIAGNOSIS — L814 Other melanin hyperpigmentation: Secondary | ICD-10-CM | POA: Diagnosis not present

## 2020-06-16 DIAGNOSIS — Z1283 Encounter for screening for malignant neoplasm of skin: Secondary | ICD-10-CM | POA: Diagnosis not present

## 2020-06-16 DIAGNOSIS — D2260 Melanocytic nevi of unspecified upper limb, including shoulder: Secondary | ICD-10-CM | POA: Diagnosis not present

## 2020-06-16 DIAGNOSIS — D225 Melanocytic nevi of trunk: Secondary | ICD-10-CM | POA: Diagnosis not present

## 2020-06-16 DIAGNOSIS — L570 Actinic keratosis: Secondary | ICD-10-CM | POA: Diagnosis not present

## 2020-06-16 DIAGNOSIS — D2270 Melanocytic nevi of unspecified lower limb, including hip: Secondary | ICD-10-CM | POA: Diagnosis not present

## 2020-06-16 DIAGNOSIS — Z85828 Personal history of other malignant neoplasm of skin: Secondary | ICD-10-CM | POA: Diagnosis not present

## 2020-06-16 DIAGNOSIS — L578 Other skin changes due to chronic exposure to nonionizing radiation: Secondary | ICD-10-CM | POA: Diagnosis not present

## 2020-06-16 DIAGNOSIS — L821 Other seborrheic keratosis: Secondary | ICD-10-CM | POA: Diagnosis not present

## 2020-06-16 DIAGNOSIS — Z85820 Personal history of malignant melanoma of skin: Secondary | ICD-10-CM | POA: Diagnosis not present

## 2020-06-16 DIAGNOSIS — D1801 Hemangioma of skin and subcutaneous tissue: Secondary | ICD-10-CM | POA: Diagnosis not present

## 2020-07-13 DIAGNOSIS — I2510 Atherosclerotic heart disease of native coronary artery without angina pectoris: Secondary | ICD-10-CM | POA: Diagnosis not present

## 2020-07-13 DIAGNOSIS — M1990 Unspecified osteoarthritis, unspecified site: Secondary | ICD-10-CM | POA: Diagnosis not present

## 2020-07-13 DIAGNOSIS — E7800 Pure hypercholesterolemia, unspecified: Secondary | ICD-10-CM | POA: Diagnosis not present

## 2020-07-13 DIAGNOSIS — I10 Essential (primary) hypertension: Secondary | ICD-10-CM | POA: Diagnosis not present

## 2020-07-13 DIAGNOSIS — N400 Enlarged prostate without lower urinary tract symptoms: Secondary | ICD-10-CM | POA: Diagnosis not present

## 2020-09-12 DIAGNOSIS — C4361 Malignant melanoma of right upper limb, including shoulder: Secondary | ICD-10-CM | POA: Diagnosis not present

## 2020-09-12 DIAGNOSIS — Z85820 Personal history of malignant melanoma of skin: Secondary | ICD-10-CM | POA: Diagnosis not present

## 2020-09-12 DIAGNOSIS — Z8589 Personal history of malignant neoplasm of other organs and systems: Secondary | ICD-10-CM | POA: Diagnosis not present

## 2020-09-12 DIAGNOSIS — Z08 Encounter for follow-up examination after completed treatment for malignant neoplasm: Secondary | ICD-10-CM | POA: Diagnosis not present

## 2020-09-12 DIAGNOSIS — Z87891 Personal history of nicotine dependence: Secondary | ICD-10-CM | POA: Diagnosis not present

## 2020-11-10 DIAGNOSIS — I10 Essential (primary) hypertension: Secondary | ICD-10-CM | POA: Diagnosis not present

## 2020-11-10 DIAGNOSIS — I2510 Atherosclerotic heart disease of native coronary artery without angina pectoris: Secondary | ICD-10-CM | POA: Diagnosis not present

## 2020-11-10 DIAGNOSIS — N400 Enlarged prostate without lower urinary tract symptoms: Secondary | ICD-10-CM | POA: Diagnosis not present

## 2020-12-12 DIAGNOSIS — C439 Malignant melanoma of skin, unspecified: Secondary | ICD-10-CM | POA: Diagnosis not present

## 2020-12-12 DIAGNOSIS — Z85820 Personal history of malignant melanoma of skin: Secondary | ICD-10-CM | POA: Diagnosis not present

## 2020-12-12 DIAGNOSIS — C779 Secondary and unspecified malignant neoplasm of lymph node, unspecified: Secondary | ICD-10-CM | POA: Diagnosis not present

## 2020-12-12 DIAGNOSIS — C4361 Malignant melanoma of right upper limb, including shoulder: Secondary | ICD-10-CM | POA: Diagnosis not present

## 2020-12-22 DIAGNOSIS — D225 Melanocytic nevi of trunk: Secondary | ICD-10-CM | POA: Diagnosis not present

## 2020-12-22 DIAGNOSIS — L821 Other seborrheic keratosis: Secondary | ICD-10-CM | POA: Diagnosis not present

## 2020-12-22 DIAGNOSIS — L814 Other melanin hyperpigmentation: Secondary | ICD-10-CM | POA: Diagnosis not present

## 2020-12-22 DIAGNOSIS — D492 Neoplasm of unspecified behavior of bone, soft tissue, and skin: Secondary | ICD-10-CM | POA: Diagnosis not present

## 2020-12-22 DIAGNOSIS — D1801 Hemangioma of skin and subcutaneous tissue: Secondary | ICD-10-CM | POA: Diagnosis not present

## 2020-12-22 DIAGNOSIS — I96 Gangrene, not elsewhere classified: Secondary | ICD-10-CM | POA: Diagnosis not present

## 2020-12-22 DIAGNOSIS — Z85820 Personal history of malignant melanoma of skin: Secondary | ICD-10-CM | POA: Diagnosis not present

## 2020-12-22 DIAGNOSIS — D2260 Melanocytic nevi of unspecified upper limb, including shoulder: Secondary | ICD-10-CM | POA: Diagnosis not present

## 2020-12-22 DIAGNOSIS — Z85828 Personal history of other malignant neoplasm of skin: Secondary | ICD-10-CM | POA: Diagnosis not present

## 2020-12-22 DIAGNOSIS — I803 Phlebitis and thrombophlebitis of lower extremities, unspecified: Secondary | ICD-10-CM | POA: Diagnosis not present

## 2020-12-22 DIAGNOSIS — D485 Neoplasm of uncertain behavior of skin: Secondary | ICD-10-CM | POA: Diagnosis not present

## 2020-12-22 DIAGNOSIS — Z1283 Encounter for screening for malignant neoplasm of skin: Secondary | ICD-10-CM | POA: Diagnosis not present

## 2020-12-22 DIAGNOSIS — D2270 Melanocytic nevi of unspecified lower limb, including hip: Secondary | ICD-10-CM | POA: Diagnosis not present

## 2020-12-22 DIAGNOSIS — L570 Actinic keratosis: Secondary | ICD-10-CM | POA: Diagnosis not present

## 2020-12-22 DIAGNOSIS — Z87891 Personal history of nicotine dependence: Secondary | ICD-10-CM | POA: Diagnosis not present

## 2020-12-22 DIAGNOSIS — L578 Other skin changes due to chronic exposure to nonionizing radiation: Secondary | ICD-10-CM | POA: Diagnosis not present

## 2020-12-22 DIAGNOSIS — L568 Other specified acute skin changes due to ultraviolet radiation: Secondary | ICD-10-CM | POA: Diagnosis not present

## 2021-01-16 DIAGNOSIS — C439 Malignant melanoma of skin, unspecified: Secondary | ICD-10-CM | POA: Diagnosis not present

## 2021-01-16 DIAGNOSIS — C4361 Malignant melanoma of right upper limb, including shoulder: Secondary | ICD-10-CM | POA: Diagnosis not present

## 2021-01-16 DIAGNOSIS — C779 Secondary and unspecified malignant neoplasm of lymph node, unspecified: Secondary | ICD-10-CM | POA: Diagnosis not present

## 2021-02-06 DIAGNOSIS — Z01818 Encounter for other preprocedural examination: Secondary | ICD-10-CM | POA: Diagnosis not present

## 2021-02-06 DIAGNOSIS — I10 Essential (primary) hypertension: Secondary | ICD-10-CM | POA: Diagnosis not present

## 2021-02-06 DIAGNOSIS — C4361 Malignant melanoma of right upper limb, including shoulder: Secondary | ICD-10-CM | POA: Diagnosis not present

## 2021-02-06 DIAGNOSIS — Z87891 Personal history of nicotine dependence: Secondary | ICD-10-CM | POA: Diagnosis not present

## 2021-02-06 DIAGNOSIS — R001 Bradycardia, unspecified: Secondary | ICD-10-CM | POA: Diagnosis not present

## 2021-02-06 DIAGNOSIS — R55 Syncope and collapse: Secondary | ICD-10-CM | POA: Diagnosis not present

## 2021-02-06 DIAGNOSIS — Z951 Presence of aortocoronary bypass graft: Secondary | ICD-10-CM | POA: Diagnosis not present

## 2021-02-06 DIAGNOSIS — Z9889 Other specified postprocedural states: Secondary | ICD-10-CM | POA: Diagnosis not present

## 2021-02-09 DIAGNOSIS — Z7982 Long term (current) use of aspirin: Secondary | ICD-10-CM | POA: Diagnosis not present

## 2021-02-09 DIAGNOSIS — C799 Secondary malignant neoplasm of unspecified site: Secondary | ICD-10-CM | POA: Diagnosis not present

## 2021-02-09 DIAGNOSIS — Z79899 Other long term (current) drug therapy: Secondary | ICD-10-CM | POA: Diagnosis not present

## 2021-02-09 DIAGNOSIS — C4361 Malignant melanoma of right upper limb, including shoulder: Secondary | ICD-10-CM | POA: Diagnosis not present

## 2021-02-09 DIAGNOSIS — I2510 Atherosclerotic heart disease of native coronary artery without angina pectoris: Secondary | ICD-10-CM | POA: Diagnosis not present

## 2021-02-09 DIAGNOSIS — I10 Essential (primary) hypertension: Secondary | ICD-10-CM | POA: Diagnosis not present

## 2021-02-09 DIAGNOSIS — Z87891 Personal history of nicotine dependence: Secondary | ICD-10-CM | POA: Diagnosis not present

## 2021-02-09 DIAGNOSIS — Z951 Presence of aortocoronary bypass graft: Secondary | ICD-10-CM | POA: Diagnosis not present

## 2021-03-13 DIAGNOSIS — C4361 Malignant melanoma of right upper limb, including shoulder: Secondary | ICD-10-CM | POA: Diagnosis not present

## 2021-03-20 DIAGNOSIS — R2231 Localized swelling, mass and lump, right upper limb: Secondary | ICD-10-CM | POA: Diagnosis not present

## 2021-03-20 DIAGNOSIS — C4361 Malignant melanoma of right upper limb, including shoulder: Secondary | ICD-10-CM | POA: Diagnosis not present

## 2021-03-20 DIAGNOSIS — Z85820 Personal history of malignant melanoma of skin: Secondary | ICD-10-CM | POA: Diagnosis not present

## 2021-03-20 DIAGNOSIS — Z9889 Other specified postprocedural states: Secondary | ICD-10-CM | POA: Diagnosis not present

## 2021-04-03 DIAGNOSIS — I2699 Other pulmonary embolism without acute cor pulmonale: Secondary | ICD-10-CM | POA: Diagnosis not present

## 2021-04-03 DIAGNOSIS — R9389 Abnormal findings on diagnostic imaging of other specified body structures: Secondary | ICD-10-CM | POA: Diagnosis not present

## 2021-04-03 DIAGNOSIS — Z23 Encounter for immunization: Secondary | ICD-10-CM | POA: Diagnosis not present

## 2021-04-03 DIAGNOSIS — I10 Essential (primary) hypertension: Secondary | ICD-10-CM | POA: Diagnosis not present

## 2021-04-03 DIAGNOSIS — Z006 Encounter for examination for normal comparison and control in clinical research program: Secondary | ICD-10-CM | POA: Diagnosis not present

## 2021-04-03 DIAGNOSIS — Z87891 Personal history of nicotine dependence: Secondary | ICD-10-CM | POA: Diagnosis not present

## 2021-04-03 DIAGNOSIS — I2510 Atherosclerotic heart disease of native coronary artery without angina pectoris: Secondary | ICD-10-CM | POA: Diagnosis not present

## 2021-04-03 DIAGNOSIS — C4361 Malignant melanoma of right upper limb, including shoulder: Secondary | ICD-10-CM | POA: Diagnosis not present

## 2021-04-04 DIAGNOSIS — Z23 Encounter for immunization: Secondary | ICD-10-CM | POA: Diagnosis not present

## 2021-04-17 DIAGNOSIS — C4361 Malignant melanoma of right upper limb, including shoulder: Secondary | ICD-10-CM | POA: Diagnosis not present

## 2021-04-17 DIAGNOSIS — Z006 Encounter for examination for normal comparison and control in clinical research program: Secondary | ICD-10-CM | POA: Diagnosis not present

## 2021-04-24 DIAGNOSIS — C4361 Malignant melanoma of right upper limb, including shoulder: Secondary | ICD-10-CM | POA: Diagnosis not present

## 2021-04-24 DIAGNOSIS — Z006 Encounter for examination for normal comparison and control in clinical research program: Secondary | ICD-10-CM | POA: Diagnosis not present

## 2021-05-03 DIAGNOSIS — Z006 Encounter for examination for normal comparison and control in clinical research program: Secondary | ICD-10-CM | POA: Diagnosis not present

## 2021-05-03 DIAGNOSIS — C4361 Malignant melanoma of right upper limb, including shoulder: Secondary | ICD-10-CM | POA: Diagnosis not present

## 2021-05-04 DIAGNOSIS — E7800 Pure hypercholesterolemia, unspecified: Secondary | ICD-10-CM | POA: Diagnosis not present

## 2021-05-04 DIAGNOSIS — Z125 Encounter for screening for malignant neoplasm of prostate: Secondary | ICD-10-CM | POA: Diagnosis not present

## 2021-05-04 DIAGNOSIS — I10 Essential (primary) hypertension: Secondary | ICD-10-CM | POA: Diagnosis not present

## 2021-05-04 DIAGNOSIS — R7301 Impaired fasting glucose: Secondary | ICD-10-CM | POA: Diagnosis not present

## 2021-05-10 DIAGNOSIS — Z006 Encounter for examination for normal comparison and control in clinical research program: Secondary | ICD-10-CM | POA: Diagnosis not present

## 2021-05-10 DIAGNOSIS — C4361 Malignant melanoma of right upper limb, including shoulder: Secondary | ICD-10-CM | POA: Diagnosis not present

## 2021-05-11 DIAGNOSIS — Z0000 Encounter for general adult medical examination without abnormal findings: Secondary | ICD-10-CM | POA: Diagnosis not present

## 2021-05-11 DIAGNOSIS — Z125 Encounter for screening for malignant neoplasm of prostate: Secondary | ICD-10-CM | POA: Diagnosis not present

## 2021-05-15 DIAGNOSIS — Z9289 Personal history of other medical treatment: Secondary | ICD-10-CM | POA: Diagnosis not present

## 2021-05-15 DIAGNOSIS — C4361 Malignant melanoma of right upper limb, including shoulder: Secondary | ICD-10-CM | POA: Diagnosis not present

## 2021-05-15 DIAGNOSIS — I2699 Other pulmonary embolism without acute cor pulmonale: Secondary | ICD-10-CM | POA: Diagnosis not present

## 2021-05-15 DIAGNOSIS — Z006 Encounter for examination for normal comparison and control in clinical research program: Secondary | ICD-10-CM | POA: Diagnosis not present

## 2021-05-24 DIAGNOSIS — Z79899 Other long term (current) drug therapy: Secondary | ICD-10-CM | POA: Diagnosis not present

## 2021-05-24 DIAGNOSIS — C4361 Malignant melanoma of right upper limb, including shoulder: Secondary | ICD-10-CM | POA: Diagnosis not present

## 2021-05-24 DIAGNOSIS — Z006 Encounter for examination for normal comparison and control in clinical research program: Secondary | ICD-10-CM | POA: Diagnosis not present

## 2021-06-08 DIAGNOSIS — D3132 Benign neoplasm of left choroid: Secondary | ICD-10-CM | POA: Diagnosis not present

## 2021-06-08 DIAGNOSIS — Z961 Presence of intraocular lens: Secondary | ICD-10-CM | POA: Diagnosis not present

## 2021-06-08 DIAGNOSIS — H35373 Puckering of macula, bilateral: Secondary | ICD-10-CM | POA: Diagnosis not present

## 2021-06-08 DIAGNOSIS — H524 Presbyopia: Secondary | ICD-10-CM | POA: Diagnosis not present

## 2021-06-21 DIAGNOSIS — D3132 Benign neoplasm of left choroid: Secondary | ICD-10-CM | POA: Diagnosis not present

## 2021-06-21 DIAGNOSIS — H35373 Puckering of macula, bilateral: Secondary | ICD-10-CM | POA: Diagnosis not present

## 2021-06-21 DIAGNOSIS — H43393 Other vitreous opacities, bilateral: Secondary | ICD-10-CM | POA: Diagnosis not present

## 2021-06-21 DIAGNOSIS — H15833 Staphyloma posticum, bilateral: Secondary | ICD-10-CM | POA: Diagnosis not present

## 2021-06-21 DIAGNOSIS — H43813 Vitreous degeneration, bilateral: Secondary | ICD-10-CM | POA: Diagnosis not present

## 2021-06-26 DIAGNOSIS — R918 Other nonspecific abnormal finding of lung field: Secondary | ICD-10-CM | POA: Diagnosis not present

## 2021-06-26 DIAGNOSIS — C779 Secondary and unspecified malignant neoplasm of lymph node, unspecified: Secondary | ICD-10-CM | POA: Diagnosis not present

## 2021-06-26 DIAGNOSIS — C4361 Malignant melanoma of right upper limb, including shoulder: Secondary | ICD-10-CM | POA: Diagnosis not present

## 2021-06-26 DIAGNOSIS — I2699 Other pulmonary embolism without acute cor pulmonale: Secondary | ICD-10-CM | POA: Diagnosis not present

## 2021-07-03 DIAGNOSIS — C4361 Malignant melanoma of right upper limb, including shoulder: Secondary | ICD-10-CM | POA: Diagnosis not present

## 2021-07-03 DIAGNOSIS — C779 Secondary and unspecified malignant neoplasm of lymph node, unspecified: Secondary | ICD-10-CM | POA: Diagnosis not present

## 2021-07-05 DIAGNOSIS — N402 Nodular prostate without lower urinary tract symptoms: Secondary | ICD-10-CM | POA: Diagnosis not present

## 2021-07-05 DIAGNOSIS — R972 Elevated prostate specific antigen [PSA]: Secondary | ICD-10-CM | POA: Diagnosis not present

## 2021-07-27 DIAGNOSIS — D2270 Melanocytic nevi of unspecified lower limb, including hip: Secondary | ICD-10-CM | POA: Diagnosis not present

## 2021-07-27 DIAGNOSIS — L570 Actinic keratosis: Secondary | ICD-10-CM | POA: Diagnosis not present

## 2021-07-27 DIAGNOSIS — L821 Other seborrheic keratosis: Secondary | ICD-10-CM | POA: Diagnosis not present

## 2021-07-27 DIAGNOSIS — D2260 Melanocytic nevi of unspecified upper limb, including shoulder: Secondary | ICD-10-CM | POA: Diagnosis not present

## 2021-07-27 DIAGNOSIS — L814 Other melanin hyperpigmentation: Secondary | ICD-10-CM | POA: Diagnosis not present

## 2021-07-27 DIAGNOSIS — D1801 Hemangioma of skin and subcutaneous tissue: Secondary | ICD-10-CM | POA: Diagnosis not present

## 2021-07-27 DIAGNOSIS — D225 Melanocytic nevi of trunk: Secondary | ICD-10-CM | POA: Diagnosis not present

## 2021-07-27 DIAGNOSIS — L578 Other skin changes due to chronic exposure to nonionizing radiation: Secondary | ICD-10-CM | POA: Diagnosis not present

## 2021-07-27 DIAGNOSIS — Z85828 Personal history of other malignant neoplasm of skin: Secondary | ICD-10-CM | POA: Diagnosis not present

## 2021-07-27 DIAGNOSIS — Z1283 Encounter for screening for malignant neoplasm of skin: Secondary | ICD-10-CM | POA: Diagnosis not present

## 2021-08-18 DIAGNOSIS — R972 Elevated prostate specific antigen [PSA]: Secondary | ICD-10-CM | POA: Diagnosis not present

## 2021-08-21 DIAGNOSIS — C4361 Malignant melanoma of right upper limb, including shoulder: Secondary | ICD-10-CM | POA: Diagnosis not present

## 2021-08-25 DIAGNOSIS — Z125 Encounter for screening for malignant neoplasm of prostate: Secondary | ICD-10-CM | POA: Diagnosis not present

## 2021-08-25 DIAGNOSIS — R972 Elevated prostate specific antigen [PSA]: Secondary | ICD-10-CM | POA: Diagnosis not present

## 2021-08-25 DIAGNOSIS — N402 Nodular prostate without lower urinary tract symptoms: Secondary | ICD-10-CM | POA: Diagnosis not present

## 2021-09-12 DIAGNOSIS — Z951 Presence of aortocoronary bypass graft: Secondary | ICD-10-CM | POA: Diagnosis not present

## 2021-09-12 DIAGNOSIS — Z7901 Long term (current) use of anticoagulants: Secondary | ICD-10-CM | POA: Diagnosis not present

## 2021-09-12 DIAGNOSIS — Z87891 Personal history of nicotine dependence: Secondary | ICD-10-CM | POA: Diagnosis not present

## 2021-09-12 DIAGNOSIS — C4361 Malignant melanoma of right upper limb, including shoulder: Secondary | ICD-10-CM | POA: Diagnosis not present

## 2021-09-12 DIAGNOSIS — Z86711 Personal history of pulmonary embolism: Secondary | ICD-10-CM | POA: Diagnosis not present

## 2021-10-16 DIAGNOSIS — C4361 Malignant melanoma of right upper limb, including shoulder: Secondary | ICD-10-CM | POA: Diagnosis not present

## 2021-10-25 DIAGNOSIS — Z79899 Other long term (current) drug therapy: Secondary | ICD-10-CM | POA: Diagnosis not present

## 2021-10-25 DIAGNOSIS — C4361 Malignant melanoma of right upper limb, including shoulder: Secondary | ICD-10-CM | POA: Diagnosis not present

## 2021-10-25 DIAGNOSIS — Z8041 Family history of malignant neoplasm of ovary: Secondary | ICD-10-CM | POA: Diagnosis not present

## 2021-10-25 DIAGNOSIS — C439 Malignant melanoma of skin, unspecified: Secondary | ICD-10-CM | POA: Diagnosis not present

## 2021-10-25 DIAGNOSIS — C779 Secondary and unspecified malignant neoplasm of lymph node, unspecified: Secondary | ICD-10-CM | POA: Diagnosis not present

## 2021-10-25 DIAGNOSIS — Z808 Family history of malignant neoplasm of other organs or systems: Secondary | ICD-10-CM | POA: Diagnosis not present

## 2021-10-25 DIAGNOSIS — Z87891 Personal history of nicotine dependence: Secondary | ICD-10-CM | POA: Diagnosis not present

## 2021-10-25 DIAGNOSIS — I2699 Other pulmonary embolism without acute cor pulmonale: Secondary | ICD-10-CM | POA: Diagnosis not present

## 2021-10-25 DIAGNOSIS — Z86711 Personal history of pulmonary embolism: Secondary | ICD-10-CM | POA: Diagnosis not present

## 2021-10-25 DIAGNOSIS — Z7901 Long term (current) use of anticoagulants: Secondary | ICD-10-CM | POA: Diagnosis not present

## 2021-10-25 DIAGNOSIS — Z5112 Encounter for antineoplastic immunotherapy: Secondary | ICD-10-CM | POA: Diagnosis not present

## 2021-11-22 DIAGNOSIS — Z9289 Personal history of other medical treatment: Secondary | ICD-10-CM | POA: Diagnosis not present

## 2021-11-22 DIAGNOSIS — Z5112 Encounter for antineoplastic immunotherapy: Secondary | ICD-10-CM | POA: Diagnosis not present

## 2021-11-22 DIAGNOSIS — C779 Secondary and unspecified malignant neoplasm of lymph node, unspecified: Secondary | ICD-10-CM | POA: Diagnosis not present

## 2021-11-22 DIAGNOSIS — C773 Secondary and unspecified malignant neoplasm of axilla and upper limb lymph nodes: Secondary | ICD-10-CM | POA: Diagnosis not present

## 2021-11-22 DIAGNOSIS — Z7901 Long term (current) use of anticoagulants: Secondary | ICD-10-CM | POA: Diagnosis not present

## 2021-11-22 DIAGNOSIS — I2699 Other pulmonary embolism without acute cor pulmonale: Secondary | ICD-10-CM | POA: Diagnosis not present

## 2021-11-22 DIAGNOSIS — R7989 Other specified abnormal findings of blood chemistry: Secondary | ICD-10-CM | POA: Diagnosis not present

## 2021-11-22 DIAGNOSIS — Z87891 Personal history of nicotine dependence: Secondary | ICD-10-CM | POA: Diagnosis not present

## 2021-11-22 DIAGNOSIS — Z79899 Other long term (current) drug therapy: Secondary | ICD-10-CM | POA: Diagnosis not present

## 2021-11-22 DIAGNOSIS — C439 Malignant melanoma of skin, unspecified: Secondary | ICD-10-CM | POA: Diagnosis not present

## 2021-11-22 DIAGNOSIS — Z9889 Other specified postprocedural states: Secondary | ICD-10-CM | POA: Diagnosis not present

## 2021-11-22 DIAGNOSIS — C4361 Malignant melanoma of right upper limb, including shoulder: Secondary | ICD-10-CM | POA: Diagnosis not present

## 2021-11-22 DIAGNOSIS — D696 Thrombocytopenia, unspecified: Secondary | ICD-10-CM | POA: Diagnosis not present

## 2021-12-19 DIAGNOSIS — Z7901 Long term (current) use of anticoagulants: Secondary | ICD-10-CM | POA: Diagnosis not present

## 2021-12-19 DIAGNOSIS — C4361 Malignant melanoma of right upper limb, including shoulder: Secondary | ICD-10-CM | POA: Diagnosis not present

## 2021-12-19 DIAGNOSIS — R195 Other fecal abnormalities: Secondary | ICD-10-CM | POA: Diagnosis not present

## 2021-12-19 DIAGNOSIS — R197 Diarrhea, unspecified: Secondary | ICD-10-CM | POA: Diagnosis not present

## 2021-12-19 DIAGNOSIS — Z5112 Encounter for antineoplastic immunotherapy: Secondary | ICD-10-CM | POA: Diagnosis not present

## 2021-12-19 DIAGNOSIS — I2699 Other pulmonary embolism without acute cor pulmonale: Secondary | ICD-10-CM | POA: Diagnosis not present

## 2021-12-19 DIAGNOSIS — C439 Malignant melanoma of skin, unspecified: Secondary | ICD-10-CM | POA: Diagnosis not present

## 2021-12-19 DIAGNOSIS — Z79899 Other long term (current) drug therapy: Secondary | ICD-10-CM | POA: Diagnosis not present

## 2021-12-19 DIAGNOSIS — D696 Thrombocytopenia, unspecified: Secondary | ICD-10-CM | POA: Diagnosis not present

## 2021-12-19 DIAGNOSIS — C779 Secondary and unspecified malignant neoplasm of lymph node, unspecified: Secondary | ICD-10-CM | POA: Diagnosis not present

## 2021-12-19 DIAGNOSIS — Z87891 Personal history of nicotine dependence: Secondary | ICD-10-CM | POA: Diagnosis not present

## 2022-01-16 DIAGNOSIS — C4361 Malignant melanoma of right upper limb, including shoulder: Secondary | ICD-10-CM | POA: Diagnosis not present

## 2022-01-16 DIAGNOSIS — Z79899 Other long term (current) drug therapy: Secondary | ICD-10-CM | POA: Diagnosis not present

## 2022-01-17 DIAGNOSIS — Z7901 Long term (current) use of anticoagulants: Secondary | ICD-10-CM | POA: Diagnosis not present

## 2022-01-17 DIAGNOSIS — Z79620 Long term (current) use of immunosuppressive biologic: Secondary | ICD-10-CM | POA: Diagnosis not present

## 2022-01-17 DIAGNOSIS — R2231 Localized swelling, mass and lump, right upper limb: Secondary | ICD-10-CM | POA: Diagnosis not present

## 2022-01-17 DIAGNOSIS — D696 Thrombocytopenia, unspecified: Secondary | ICD-10-CM | POA: Diagnosis not present

## 2022-01-17 DIAGNOSIS — Z5112 Encounter for antineoplastic immunotherapy: Secondary | ICD-10-CM | POA: Diagnosis not present

## 2022-01-17 DIAGNOSIS — R197 Diarrhea, unspecified: Secondary | ICD-10-CM | POA: Diagnosis not present

## 2022-01-17 DIAGNOSIS — Z85820 Personal history of malignant melanoma of skin: Secondary | ICD-10-CM | POA: Diagnosis not present

## 2022-01-17 DIAGNOSIS — C4361 Malignant melanoma of right upper limb, including shoulder: Secondary | ICD-10-CM | POA: Diagnosis not present

## 2022-01-17 DIAGNOSIS — Z9289 Personal history of other medical treatment: Secondary | ICD-10-CM | POA: Diagnosis not present

## 2022-01-17 DIAGNOSIS — Z86711 Personal history of pulmonary embolism: Secondary | ICD-10-CM | POA: Diagnosis not present

## 2022-01-17 DIAGNOSIS — Z87891 Personal history of nicotine dependence: Secondary | ICD-10-CM | POA: Diagnosis not present

## 2022-01-26 DIAGNOSIS — Z23 Encounter for immunization: Secondary | ICD-10-CM | POA: Diagnosis not present

## 2022-02-13 DIAGNOSIS — Z7901 Long term (current) use of anticoagulants: Secondary | ICD-10-CM | POA: Diagnosis not present

## 2022-02-13 DIAGNOSIS — Z86711 Personal history of pulmonary embolism: Secondary | ICD-10-CM | POA: Diagnosis not present

## 2022-02-13 DIAGNOSIS — C4361 Malignant melanoma of right upper limb, including shoulder: Secondary | ICD-10-CM | POA: Diagnosis not present

## 2022-02-13 DIAGNOSIS — Z87891 Personal history of nicotine dependence: Secondary | ICD-10-CM | POA: Diagnosis not present

## 2022-02-13 DIAGNOSIS — D696 Thrombocytopenia, unspecified: Secondary | ICD-10-CM | POA: Diagnosis not present

## 2022-02-13 DIAGNOSIS — Z79899 Other long term (current) drug therapy: Secondary | ICD-10-CM | POA: Diagnosis not present

## 2022-02-13 DIAGNOSIS — R195 Other fecal abnormalities: Secondary | ICD-10-CM | POA: Diagnosis not present

## 2022-02-13 DIAGNOSIS — Z5112 Encounter for antineoplastic immunotherapy: Secondary | ICD-10-CM | POA: Diagnosis not present

## 2022-02-13 DIAGNOSIS — C779 Secondary and unspecified malignant neoplasm of lymph node, unspecified: Secondary | ICD-10-CM | POA: Diagnosis not present

## 2022-02-13 DIAGNOSIS — C773 Secondary and unspecified malignant neoplasm of axilla and upper limb lymph nodes: Secondary | ICD-10-CM | POA: Diagnosis not present

## 2022-02-19 DIAGNOSIS — C4361 Malignant melanoma of right upper limb, including shoulder: Secondary | ICD-10-CM | POA: Diagnosis not present

## 2022-02-19 DIAGNOSIS — I10 Essential (primary) hypertension: Secondary | ICD-10-CM | POA: Diagnosis not present

## 2022-02-19 DIAGNOSIS — I2510 Atherosclerotic heart disease of native coronary artery without angina pectoris: Secondary | ICD-10-CM | POA: Diagnosis not present

## 2022-03-12 DIAGNOSIS — C4361 Malignant melanoma of right upper limb, including shoulder: Secondary | ICD-10-CM | POA: Diagnosis not present

## 2022-03-14 DIAGNOSIS — Z87891 Personal history of nicotine dependence: Secondary | ICD-10-CM | POA: Diagnosis not present

## 2022-03-14 DIAGNOSIS — C4361 Malignant melanoma of right upper limb, including shoulder: Secondary | ICD-10-CM | POA: Diagnosis not present

## 2022-03-14 DIAGNOSIS — R195 Other fecal abnormalities: Secondary | ICD-10-CM | POA: Diagnosis not present

## 2022-03-14 DIAGNOSIS — Z79899 Other long term (current) drug therapy: Secondary | ICD-10-CM | POA: Diagnosis not present

## 2022-03-14 DIAGNOSIS — Z7901 Long term (current) use of anticoagulants: Secondary | ICD-10-CM | POA: Diagnosis not present

## 2022-03-14 DIAGNOSIS — Z5112 Encounter for antineoplastic immunotherapy: Secondary | ICD-10-CM | POA: Diagnosis not present

## 2022-03-14 DIAGNOSIS — D696 Thrombocytopenia, unspecified: Secondary | ICD-10-CM | POA: Diagnosis not present

## 2022-03-14 DIAGNOSIS — I2699 Other pulmonary embolism without acute cor pulmonale: Secondary | ICD-10-CM | POA: Diagnosis not present

## 2022-03-28 DIAGNOSIS — D2270 Melanocytic nevi of unspecified lower limb, including hip: Secondary | ICD-10-CM | POA: Diagnosis not present

## 2022-03-28 DIAGNOSIS — L821 Other seborrheic keratosis: Secondary | ICD-10-CM | POA: Diagnosis not present

## 2022-03-28 DIAGNOSIS — D225 Melanocytic nevi of trunk: Secondary | ICD-10-CM | POA: Diagnosis not present

## 2022-03-28 DIAGNOSIS — D2260 Melanocytic nevi of unspecified upper limb, including shoulder: Secondary | ICD-10-CM | POA: Diagnosis not present

## 2022-03-28 DIAGNOSIS — Z85828 Personal history of other malignant neoplasm of skin: Secondary | ICD-10-CM | POA: Diagnosis not present

## 2022-03-28 DIAGNOSIS — L570 Actinic keratosis: Secondary | ICD-10-CM | POA: Diagnosis not present

## 2022-03-28 DIAGNOSIS — L578 Other skin changes due to chronic exposure to nonionizing radiation: Secondary | ICD-10-CM | POA: Diagnosis not present

## 2022-03-28 DIAGNOSIS — L814 Other melanin hyperpigmentation: Secondary | ICD-10-CM | POA: Diagnosis not present

## 2022-03-28 DIAGNOSIS — Z1283 Encounter for screening for malignant neoplasm of skin: Secondary | ICD-10-CM | POA: Diagnosis not present

## 2022-04-16 DIAGNOSIS — Z7901 Long term (current) use of anticoagulants: Secondary | ICD-10-CM | POA: Diagnosis not present

## 2022-04-16 DIAGNOSIS — Z9221 Personal history of antineoplastic chemotherapy: Secondary | ICD-10-CM | POA: Diagnosis not present

## 2022-04-16 DIAGNOSIS — Z86711 Personal history of pulmonary embolism: Secondary | ICD-10-CM | POA: Diagnosis not present

## 2022-04-16 DIAGNOSIS — C779 Secondary and unspecified malignant neoplasm of lymph node, unspecified: Secondary | ICD-10-CM | POA: Diagnosis not present

## 2022-04-16 DIAGNOSIS — Z5112 Encounter for antineoplastic immunotherapy: Secondary | ICD-10-CM | POA: Diagnosis not present

## 2022-04-16 DIAGNOSIS — Z87891 Personal history of nicotine dependence: Secondary | ICD-10-CM | POA: Diagnosis not present

## 2022-04-16 DIAGNOSIS — D696 Thrombocytopenia, unspecified: Secondary | ICD-10-CM | POA: Diagnosis not present

## 2022-04-16 DIAGNOSIS — Z08 Encounter for follow-up examination after completed treatment for malignant neoplasm: Secondary | ICD-10-CM | POA: Diagnosis not present

## 2022-04-16 DIAGNOSIS — C4361 Malignant melanoma of right upper limb, including shoulder: Secondary | ICD-10-CM | POA: Diagnosis not present

## 2022-04-16 DIAGNOSIS — Z5111 Encounter for antineoplastic chemotherapy: Secondary | ICD-10-CM | POA: Diagnosis not present

## 2022-04-16 DIAGNOSIS — Z85820 Personal history of malignant melanoma of skin: Secondary | ICD-10-CM | POA: Diagnosis not present

## 2023-05-22 ENCOUNTER — Telehealth: Payer: Self-pay | Admitting: *Deleted

## 2023-05-22 ENCOUNTER — Encounter: Payer: Self-pay | Admitting: Neurology

## 2023-05-22 ENCOUNTER — Ambulatory Visit (INDEPENDENT_AMBULATORY_CARE_PROVIDER_SITE_OTHER): Payer: Medicare Other | Admitting: Neurology

## 2023-05-22 VITALS — BP 180/90 | Ht 71.0 in | Wt 170.0 lb

## 2023-05-22 DIAGNOSIS — H903 Sensorineural hearing loss, bilateral: Secondary | ICD-10-CM | POA: Insufficient documentation

## 2023-05-22 DIAGNOSIS — Z8669 Personal history of other diseases of the nervous system and sense organs: Secondary | ICD-10-CM | POA: Diagnosis not present

## 2023-05-22 DIAGNOSIS — I2510 Atherosclerotic heart disease of native coronary artery without angina pectoris: Secondary | ICD-10-CM | POA: Insufficient documentation

## 2023-05-22 DIAGNOSIS — R29898 Other symptoms and signs involving the musculoskeletal system: Secondary | ICD-10-CM | POA: Insufficient documentation

## 2023-05-22 DIAGNOSIS — Z85820 Personal history of malignant melanoma of skin: Secondary | ICD-10-CM | POA: Insufficient documentation

## 2023-05-22 DIAGNOSIS — F02B18 Dementia in other diseases classified elsewhere, moderate, with other behavioral disturbance: Secondary | ICD-10-CM | POA: Insufficient documentation

## 2023-05-22 NOTE — Progress Notes (Addendum)
 [facility]  Provider:  Dr Izola Teague Referring Provider: Jimmey Mould, MD Primary Care Physician:  Jimmey Mould, MD  Chief Complaint  Patient presents with   New Patient (Initial Visit)    Pt in 2 with son  Pt here for decline of short term memory     HPI:  Henry Gaines is a 83 y.o. male and seen here upon referral from Dr. Avanell Bob for a Consultation/ Evaluation of memory loss in the setting of hearing loss.  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  He has no history of brain injuries, no history of radiation but treatment for Melanoma with LN infiltration.  Right arm.  He was experimentally treated with polio vaccine.  He had CAD, venous harvest right leg , ByPASS  surgery quadruple, Dr Ed Gondola, 2005,  He is not bothered or not aware of memory troubles.  He is bothered by hearing loss.  He speaks loudly.  He reports diplopia- while driving - 4 months ago  - had since Laser surgery, and surgery for cataract.   GDS endorsed at 5/ 15 points/   This patient's son  reports onset of memory  loss  over a period of 3 years.  His father is getting lost, driving. He has not been driving long distances. He is not handling finances, his daughter does this. He forgets appointments.  Confused about dates and times.  He is no longer driving to his beach house in [place].       05/22/2023   11:01 AM  Montreal Cognitive Assessment   Visuospatial/ Executive (0/5) 2  Naming (0/3) 3  Attention: Read list of digits (0/2) 1  Attention: Read list of letters (0/1) 1  Attention: Serial 7 subtraction starting at 100 (0/3) 3  Language: Repeat phrase (0/2) 0  Language : Fluency (0/1) 1  Abstraction (0/2) 1  Delayed Recall (0/5) 0  Orientation (0/6) 4  Total 16          No data to display             Review of Systems: Out of a complete 14 system review, the  patient complains of only the following symptoms, and all other reviewed systems are negative.  Epworth Sleepiness score:   Social History   Socioeconomic History   Marital status: Widowed    Spouse name: Not on file   Number of children: Not on file   Years of education: Not on file   Highest education level: Not on file  Occupational History   Not on file  Tobacco Use   Smoking status: Former   Smokeless tobacco: Never   Tobacco comments:    30 years ago  Substance and Sexual Activity   Alcohol use: Yes    Alcohol/week: 7.0 standard drinks of alcohol    Types: 7 Glasses of wine per week   Drug use: Never   Sexual activity: Not Currently    Partners: Male  Other Topics Concern   Not on file  Social History Narrative   Pt lives with granddaughter    Retired    Social Drivers of Corporate investment banker Strain: Not on file  Food Insecurity: Not on file  Transportation Needs: Not on file  Physical Activity: Not on file  Stress: Not on file  Social Connections: Not on file  Intimate Partner Violence: Not on file  Family History  Problem Relation Age of Onset   Alzheimer's disease Neg Hx     Past Medical History:  Diagnosis Date   History of heart bypass surgery    HOH (hard of hearing)    Hypertension    Skin cancer (melanoma) (HCC)    on right arm    Past Surgical History:  Procedure Laterality Date   CATARACT EXTRACTION Bilateral     Current Outpatient Medications  Medication Sig Dispense Refill   Ascorbic Acid (VITAMIN C) 500 MG CAPS Take 1 capsule by mouth daily.     aspirin EC 81 MG tablet Take 81 mg by mouth daily. Swallow whole.     Cholecalciferol (VITAMIN D-3) 125 MCG (5000 UT) TABS Take 1 tablet by mouth daily.     Coenzyme Q10 (COQ10) 100 MG CAPS Take by mouth.     Cyanocobalamin (VITAMIN B-12) 2500 MCG SUBL Place 1 tablet under the tongue daily.     doxazosin  (CARDURA ) 2 MG tablet Take 2 mg by mouth every evening.  4   folic acid  (FOLVITE) 400 MCG tablet Take 400 mcg by mouth daily.     Lactase (DAIRY DIGESTIVE PO) Take 1 tablet by mouth daily.     magnesium  gluconate (MAGONATE) 500 MG tablet Take 500 mg by mouth every evening.     metoprolol  tartrate (LOPRESSOR ) 25 MG tablet Take 1 tablet (25 mg total) by mouth 2 (two) times daily. 60 tablet 0   Misc Natural Products (JOINT HEALTH PO) Take 1 tablet by mouth daily.     Multiple Vitamins-Minerals (ZINC PO) Take 1 tablet by mouth daily.     niacin 250 MG CR capsule Take 250 mg by mouth 2 (two) times daily with a meal.     Omega-3 Krill Oil 500 MG CAPS Take 1 capsule by mouth daily.     ramipril  (ALTACE ) 10 MG capsule Take 10 mg by mouth daily.  3   triamterene -hydrochlorothiazide  (MAXZIDE -25) 37.5-25 MG tablet Take 1 tablet by mouth daily.  1   No current facility-administered medications for this visit.    Allergies as of 05/22/2023   (No Known Allergies)    Vitals: Ht 5\' 11"  (1.803 m)   Wt 170 lb (77.1 kg)   BMI 23.71 kg/m  Last Weight:  Wt Readings from Last 1 Encounters:  05/22/23 170 lb (77.1 kg)   Last Height:   Ht Readings from Last 1 Encounters:  05/22/23 5\' 11"  (1.803 m)   Physical exam:  General: The patient is awake, alert and appears not in acute distress.  The patient is well groomed. Head: Normocephalic, atraumatic.  Neck is supple.   Cardiovascular:  Regular rate and palpable peripheral pulse:  Respiratory: clear to auscultation.  Mallampati, Skin:  Without evidence of edema,  sun damage to hands, face and extremities.  Trunk: normal posture.lost weight 35 pounds in 12 months.    Neurologic exam : The patient is awake and alert, oriented to place and time.  Memory subjective  described as intact.  There is a normal attention span & concentration ability.  Speech is fluent without  dysarthria, dysphonia or aphasia.  Mood and affect are appropriate.  Cranial nerves: Pupils are equal and briskly reactive to light. Funduscopic exam  without  evidence of pallor or edema. Extraocular movements  in vertical and horizontal planes intact and without nystagmus. Visual fields by finger perimetry are intact. Hearing severely impaired while  not using hearing aids. .THIS MAY AFFECTED MEMORY TEST_  Facial sensation intact to fine touch. Facial motor strength is symmetric and tongue and uvula move midline.  Motor exam:  Increased tone and  cogwheeling, gegenhalten (!)  .  But equal muscle bulk and symmetric strength in all extremities.  Grip Strength  Proximal strength of shoulder muscles and hip flexors was equally strong .  Sensory:  Fine touch and vibration were tested . Proprioception was tested in the upper extremities only and was  impaired, pronator dritf and drift in general.   Coordination: Rapid alternating movements in the fingers/hands were slowed  Finger-to-nose maneuver was tested and showed  evidence of dysmetria or tremor.  Gait and station: Patient walked without assistive device . Core Strength within normal limits. Stance is stable and of wider base.  Walks with right foot outwards , shuffling, loss of arm swing .     Tandem gait is  not possible ,turns with 3 Steps are unfragmented.  Romberg testing is normal.  Deep tendon reflexes: in the  upper and lower extremities are symmetric and  brisk without Clonus. Babinski maneuver response is  downgoing.   Assessment: Total time for face to face interview and examination, for review of  images and laboratory testing, neurophysiology testing and pre-existing records, including out-of -network , was 45 minutes.  Assessment is as follows here: NO DRIVING, son accepted this with some relief, patient is reluctant.  The patient is  not showing insight  in his impairment and the danger this may pose.  This may still be a result of his hearing impairment ( the processing of information) but I think he is delusional about his abilities at this point.   Phone message  to Dr Avanell Bob.   1)   moderate degree of cognitive impairment, complicated by presenting without hearing aids- MOCA 16/ 30 points !! 2)   cogwheeling, wider based gait,  tremor and mild dysmetria. Possible parkinsonian manifestation.  No falls.    Plan:  Treatment plan and additional workup planned after today includes:   1)  ATN. Amyloid panel,  2)  MRI brain , CBC diff, dementia panel.   Had lipids, CMET and PET scan for whole body scan for cancer search.  05-12-2023    RV in 4-6 months with Np or me,  memory testing with hearing aids in place. (MMSE follows any MOCA result that is lower than 23 / 30 points )   Neomia Banner, MD

## 2023-05-22 NOTE — Addendum Note (Signed)
 Addended by: Neomia Banner on: 05/22/2023 12:17 PM   Modules accepted: Orders

## 2023-05-22 NOTE — Telephone Encounter (Signed)
 Spoke to son (checked DPR) informed him since BP was elevated today at [facility] Please call PCP to make him aware . Son was unsure if  pt had taken his BP medication today however he will call PCP to make aware Son thanked me for calling

## 2023-05-22 NOTE — Patient Instructions (Addendum)
 Driving After a Stroke Driving after a stroke can be dangerous because a stroke can cause physical, emotional, cognitive, and behavioral changes. Damage to your brain and other parts of your nervous system may affect your ability to drive. You may have weakness, stiffness, and pain. You also may have problems moving, talking, seeing, touching, or problem-solving. A stroke can also cause an inability to move, or paralysis, on one side of your body. How does a stroke affect my driving? A family member may be the first to notice that it is not safe for you to drive. You may have problems with: Your vision. Talking and communicating. Weakness, pain, and stiffness in your arms or legs. Reacting quickly to changes on the road. Using the steering wheel, pedals, and other parts of the car. Thinking while driving. Judgment on the road. How do I recognize changes in my ability to drive? If you do drive, signs that driving may be unsafe for you include: Driving too fast or too slow. Needing help from others while driving. Not paying attention to street signs or signals. Making bad decisions while driving. Not keeping enough distance between cars. Drifting into other lanes. Becoming confused, angry, or frustrated. Getting lost in familiar places. Having accidents while driving. What actions can I take to manage driving after a stroke?  Talk to your health care provider Ask your health care provider when it is safe for you to drive. Laws on driving after a stroke vary by state. Your health care provider may recommend that you: Get a driving evaluation to have your vision, thinking, reaction time, and driving skills tested. Take a driving rehabilitation program for people who have had a stroke. Take a driving class or a retraining program. Ask about safety devices for drivers Adaptive equipment refers to devices that can help people who have had a stroke to drive and do other activities. You may  need: A wheelchair-accessible car. Special hand controls in the car. Pedal extensions for the car. A seat base to help you stay positioned in your seat. Lifts and ramps to help you get in and out of the car. Where to find more information American Stroke Association: www.stroke.org Summary Damage to your brain and other parts of your nervous system may affect your ability to drive. Ask your health care provider when it is safe for you to drive. They may recommend that you get a driving evaluation or take a driving class. A family member may be the first to notice that it is not safe for you to drive. You may need adaptive equipment to drive safely. This information is not intended to replace advice given to you by your health care provider. Make sure you discuss any questions you have with your health care provider. Document Revised: 07/24/2021 Document Reviewed: 07/24/2021 Elsevier Patient Education  2024 [employer]. Preventing Motor Vehicle Crashes, Adult Driving is important for many people, but it can be very dangerous when drivers do not follow rules for safe driving. Every year, millions of people are injured and thousands die in motor vehicle crashes. The personal and economic costs of motor vehicle accidents can be high. Many deaths and injuries could be avoided if more drivers took steps to prevent motor vehicle collisions. You can reduce your risk of injuries or death by taking certain actions. How can motor vehicle crashes affect me? A motor vehicle crash can have a big impact on your life and your family. Motor vehicle crashes may: Kill or injure you  or anyone in the car or on the road, including innocent bystanders. Cause permanent injuries, which may lead to disability, loss of lifestyle, and chronic pain. Cause a lot of mental and physical problems to you and others. Put you through long-term treatment, such as rehab and therapy. Affect your finances. This can happen  if: You miss work or are unable to work. You have to pay for treatment. You have to pay fines or pay more for car insurance. What can increase my risk for motor vehicle crashes? The following factors may increase your risk of an accident: Driving while under the influence of alcohol or drugs. You can go to jail or pay fines for this. Being an older adult. This may also lead to serious injuries from car crashes. If you are an older adult, make sure to drive carefully and obey all traffic laws. Being distracted while driving. This may include: Eating, talking, or texting on the phone while driving. Adjusting the dashboard controls while driving. Taking your eyes off the road or hands off the steering wheel. What actions can I take to prevent motor vehicle crashes?  Be responsible Driving safely is important for you, your friends, and your community. Follow these tips: Do not drive after using drugs. This includes some prescription and over-the-counter medicines that can make you drowsy or cause delayed reaction times. If you are taking prescription medicines, ask your health care provider if it is safe for you to drive. Do not drive after drinking alcohol, even after having just one drink. Do not ride in a car with a driver who has been drinking alcohol. Try to stop others from driving after drinking. If you are not sure that the driver is sober, do not get in the vehicle. Use a ride-sharing service, taxi, or public transportation. Always wear a seat belt properly. Do this every time you are in a car. Make sure others in the car, especially the driver, are wearing their seat belts. This is the easiest way to prevent serious injuries or death from a car crash. Avoid distractions. Put away your phone. Do not text or use social media while driving. Know your vehicle. Know how to turn on your wipers and lights. Before driving Adjust mirrors and seats to your liking. Choose your music and do not  change it until you arrive at your destination. Set your navigation system. Get plenty of rest. Do not drive if you are tired or drowsy. Drive safely Keep your eyes on the road and both hands on the steering wheel to give you the most control. Wait until the car is fully stopped before making any adjustments to the dashboard, such as music, climate controls, and navigation. Obey speed limits and other traffic laws at all times. Do not speed. Stop at all red lights and stop signs. Do not eat or drink while driving. Do not use a mobile phone or any other digital device while driving. Do not text while driving. If you are riding in a car and the driver is using a phone or digital device, tell them to stop. Be alert and cautious of those around you while driving. If you notice a dangerous driver, give that person plenty of space on the road or choose an alternate route. Give yourself at least 2 seconds of following distance between you and the driver in front of you. At higher speeds or if roads are difficult, the distance should increase to give you enough time to maneuver or brake  if you need to. Pay close attention to road conditions. Slow down when there is rain, snow, or icy roads. Drive more slowly and more cautiously if weather conditions are bad, if it is dark outside, or if the roads are narrow or twisty. Driving is a privilege. Do not drive aggressively. Occasionally you may be mad or frustrated at others on the road, traffic congestion, or other reasons. Take a deep breath and maintain your calm. Do not try to get back at other drivers by trying to cut them off, tailgating them, or racing them. All of these behaviors can cause car crashes. If you have other passengers in the car, make sure they are not distracting you. Where to find more information Centers for Disease Control and Prevention: [employer]: https://ball-collins.biz/ This information is not intended to  replace advice given to you by your health care provider. Make sure you discuss any questions you have with your health care provider. Document Revised: 11/13/2021 Document Reviewed: 10/16/2021 Elsevier Patient Education  2024 Elsevier Inc.      Assessment: Total time for face to face interview and examination, for review of  images and laboratory testing, neurophysiology testing and pre-existing records, including out-of -network , was 45 minutes.  Assessment is as follows here:  1)   moderate degree of cognitive impairment, complicated by presenting without hearing aids- MOCA 16/ 30 points !! 2)   cogwheeling, wider based gait,  tremor and mild dysmetria. Possible parkinsonian manifestation.  No falls.    Plan:  Treatment plan and additional workup planned after today includes:   1)  ATN. Amyloid panel,  2)  MRI brain , CBC diff, dementia panel.   Had lipids, CMET and PET scan for whole body scan for cancer search.  05-12-2023    RV in 4-6 months with Np or me, MMSE if MOCA is lower than 23 / 30   Memory Compensation Strategies  Use "WARM" strategy.  W= write it down  A= associate it  R= repeat it  M= make a mental note  2.   You can keep a Glass blower/designer.  Use a 3-ring notebook with sections for the following: calendar, important names and phone numbers,  medications, doctors' names/phone numbers, lists/reminders, and a section to journal what you did  each day.   3.    Use a calendar to write appointments down.  4.    Write yourself a schedule for the day.  This can be placed on the calendar or in a separate section of the Memory Notebook.  Keeping a  regular schedule can help memory.  5.    Use medication organizer with sections for each day or morning/evening pills.  You may need help loading it  6.    Keep a basket, or pegboard by the door.  Place items that you need to take out with you in the basket or on the pegboard.  You may also want to  include a message  board for reminders.  7.    Use sticky notes.  Place sticky notes with reminders in a place where the task is performed.  For example: " turn off the  stove" placed by the stove, "lock the door" placed on the door at eye level, " take your medications" on  the bathroom mirror or by the place where you normally take your medications.  8.    Use alarms/timers.  Use while cooking to remind yourself to check on food  or as a reminder to take your medicine, or as a  reminder to make a call, or as a reminder to perform another task, etc. Management of Memory Problems  There are some general things you can do to help manage your memory problems.  Your memory may not in fact recover, but by using techniques and strategies you will be able to manage your memory difficulties better.  1)  Establish a routine. Try to establish and then stick to a regular routine.  By doing this, you will get used to what to expect and you will reduce the need to rely on your memory.  Also, try to do things at the same time of day, such as taking your medication or checking your calendar first thing in the morning. Think about think that you can do as a part of a regular routine and make a list.  Then enter them into a daily planner to remind you.  This will help you establish a routine.  2)  Organize your environment. Organize your environment so that it is uncluttered.  Decrease visual stimulation.  Place everyday items such as keys or cell phone in the same place every day (ie.  Basket next to front door) Use post it notes with a brief message to yourself (ie. Turn off light, lock the door) Use labels to indicate where things go (ie. Which cupboards are for food, dishes, etc.) Keep a notepad and pen by the telephone to take messages  3)  Memory Aids A diary or journal/notebook/daily planner Making a list (shopping list, chore list, to do list that needs to be done) Using an alarm as a reminder (kitchen timer or cell  phone alarm) Using cell phone to store information (Notes, Calendar, Reminders) Calendar/White board placed in a prominent position Post-it notes  In order for memory aids to be useful, you need to have good habits.  It's no good remembering to make a note in your journal if you don't remember to look in it.  Try setting aside a certain time of day to look in journal.  4)  Improving mood and managing fatigue. There may be other factors that contribute to memory difficulties.  Factors, such as anxiety, depression and tiredness can affect memory. Regular gentle exercise can help improve your mood and give you more energy. Simple relaxation techniques may help relieve symptoms of anxiety Try to get back to completing activities or hobbies you enjoyed doing in the past. Learn to pace yourself through activities to decrease fatigue. Find out about some local support groups where you can share experiences with others. Try and achieve 7-8 hours of sleep at night.

## 2023-05-27 ENCOUNTER — Encounter: Payer: Self-pay | Admitting: Neurology

## 2023-05-29 LAB — ATN PROFILE
A -- Beta-amyloid 42/40 Ratio: 0.097 — ABNORMAL LOW (ref >0.102)
Beta-amyloid 40: 193.6 pg/mL
Beta-amyloid 42: 18.72 pg/mL
N -- NfL, Plasma: 5 pg/mL (ref 0.00–11.55)
T -- p-tau181: 1.77 pg/mL — ABNORMAL HIGH (ref 0.00–0.97)

## 2023-05-29 LAB — PROTEIN ELECTROPHORESIS, SERUM
A/G Ratio: 1.7 (ref 0.7–1.7)
Albumin ELP: 4 g/dL (ref 2.9–4.4)
Alpha 1: 0.2 g/dL (ref 0.0–0.4)
Alpha 2: 0.7 g/dL (ref 0.4–1.0)
Beta: 0.9 g/dL (ref 0.7–1.3)
Gamma Globulin: 0.6 g/dL (ref 0.4–1.8)
Globulin, Total: 2.4 g/dL (ref 2.2–3.9)
Total Protein: 6.4 g/dL (ref 6.0–8.5)

## 2023-05-29 LAB — APOE ALZHEIMER'S RISK

## 2023-05-29 LAB — TSH+FREE T4
Free T4: 1.41 ng/dL (ref 0.82–1.77)
TSH: 1.66 u[IU]/mL (ref 0.450–4.500)

## 2023-05-29 LAB — ANA W/REFLEX: Anti Nuclear Antibody (ANA): NEGATIVE

## 2023-05-29 LAB — VITAMIN B12: Vitamin B-12: 574 pg/mL (ref 232–1245)

## 2023-05-29 LAB — SEDIMENTATION RATE: Sed Rate: 5 mm/h (ref 0–30)

## 2023-05-29 LAB — HOMOCYSTEINE: Homocysteine: 14.1 umol/L (ref 0.0–21.3)

## 2023-05-29 LAB — METHYLMALONIC ACID, SERUM: Methylmalonic Acid: 200 nmol/L (ref 0–378)

## 2023-06-05 ENCOUNTER — Ambulatory Visit
Admission: RE | Admit: 2023-06-05 | Discharge: 2023-06-05 | Disposition: A | Discharge status: Home / Self Care | Payer: Medicare Other | Source: Ambulatory Visit | Attending: Neurology | Admitting: Neurology

## 2023-06-05 DIAGNOSIS — H903 Sensorineural hearing loss, bilateral: Secondary | ICD-10-CM | POA: Diagnosis not present

## 2023-06-05 DIAGNOSIS — F02B18 Dementia in other diseases classified elsewhere, moderate, with other behavioral disturbance: Secondary | ICD-10-CM

## 2023-06-05 DIAGNOSIS — Z8669 Personal history of other diseases of the nervous system and sense organs: Secondary | ICD-10-CM

## 2023-06-05 DIAGNOSIS — R29898 Other symptoms and signs involving the musculoskeletal system: Secondary | ICD-10-CM | POA: Diagnosis not present

## 2023-06-05 DIAGNOSIS — Z85820 Personal history of malignant melanoma of skin: Secondary | ICD-10-CM

## 2023-06-05 MED ORDER — GADOPICLENOL 0.5 MMOL/ML IV SOLN
8.0000 mL | Freq: Once | INTRAVENOUS | Status: AC | PRN
  Administered 2023-06-05: 8 mL via INTRAVENOUS

## 2023-06-10 ENCOUNTER — Encounter: Payer: Self-pay | Admitting: Neurology

## 2023-06-12 ENCOUNTER — Ambulatory Visit (INDEPENDENT_AMBULATORY_CARE_PROVIDER_SITE_OTHER): Payer: Medicare Other | Admitting: Podiatry

## 2023-06-12 ENCOUNTER — Encounter: Payer: Self-pay | Admitting: Podiatry

## 2023-06-12 DIAGNOSIS — M79675 Pain in left toe(s): Secondary | ICD-10-CM | POA: Insufficient documentation

## 2023-06-12 DIAGNOSIS — M79674 Pain in right toe(s): Secondary | ICD-10-CM | POA: Diagnosis not present

## 2023-06-12 DIAGNOSIS — B351 Tinea unguium: Secondary | ICD-10-CM | POA: Diagnosis not present

## 2023-06-12 NOTE — Progress Notes (Signed)
 This patient presents to the office with chief complaint of long thick painful nails.  Patient says the nails are painful walking and wearing shoes.  This patient is unable to self treat.  This patient is unable to trim his  nails since she is unable to reach his  nails.  he presents to the office for preventative foot care services.  General Appearance  Alert, conversant and in no acute stress.  Vascular  Dorsalis pedis and posterior tibial  pulses are palpable  bilaterally.  Capillary return is within normal limits  bilaterally. Temperature is within normal limits  bilaterally.  Neurologic  Senn-Weinstein monofilament wire test within normal limits  bilaterally. Muscle power within normal limits bilaterally.  Nails Thick disfigured discolored nails with subungual debris  from hallux to fifth toes bilaterally. No evidence of bacterial infection or drainage bilaterally.  Orthopedic  No limitations of motion  feet .  No crepitus or effusions noted.  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  Skin  normotropic skin with no porokeratosis noted bilaterally.  No signs of infections or ulcers noted.     Onychomycosis  Nails  B/L.  Pain in right toes  Pain in left toes  Debridement of nails both feet followed trimming the nails with dremel tool.    RTC 3 months.   Ruffin Cotton DPM

## 2023-09-10 ENCOUNTER — Encounter: Payer: Self-pay | Admitting: Podiatry

## 2023-09-10 ENCOUNTER — Ambulatory Visit (INDEPENDENT_AMBULATORY_CARE_PROVIDER_SITE_OTHER): Payer: Medicare Other | Admitting: Podiatry

## 2023-09-10 DIAGNOSIS — B351 Tinea unguium: Secondary | ICD-10-CM

## 2023-09-10 DIAGNOSIS — M79675 Pain in left toe(s): Secondary | ICD-10-CM

## 2023-09-10 DIAGNOSIS — M79674 Pain in right toe(s): Secondary | ICD-10-CM

## 2023-09-10 NOTE — Progress Notes (Signed)
 This patient presents to the office with chief complaint of long thick painful nails.  Patient says the nails are painful walking and wearing shoes.  This patient is unable to self treat.  This patient is unable to trim his  nails since she is unable to reach his  nails.  he presents to the office for preventative foot care services.  General Appearance  Alert, conversant and in no acute stress.  Vascular  Dorsalis pedis and posterior tibial  pulses are palpable  bilaterally.  Capillary return is within normal limits  bilaterally. Temperature is within normal limits  bilaterally.  Neurologic  Senn-Weinstein monofilament wire test within normal limits  bilaterally. Muscle power within normal limits bilaterally.  Nails Thick disfigured discolored nails with subungual debris  from hallux to fifth toes bilaterally. No evidence of bacterial infection or drainage bilaterally.  Orthopedic  No limitations of motion  feet .  No crepitus or effusions noted.  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  Skin  normotropic skin with no porokeratosis noted bilaterally.  No signs of infections or ulcers noted.     Onychomycosis  Nails  B/L.  Pain in right toes  Pain in left toes  Debridement of nails both feet followed trimming the nails with dremel tool.    RTC 3 months.   Ruffin Cotton DPM

## 2023-12-10 ENCOUNTER — Ambulatory Visit (INDEPENDENT_AMBULATORY_CARE_PROVIDER_SITE_OTHER): Admitting: Podiatry

## 2023-12-10 ENCOUNTER — Encounter: Payer: Self-pay | Admitting: Podiatry

## 2023-12-10 DIAGNOSIS — M79674 Pain in right toe(s): Secondary | ICD-10-CM

## 2023-12-10 DIAGNOSIS — B351 Tinea unguium: Secondary | ICD-10-CM

## 2023-12-10 DIAGNOSIS — M79675 Pain in left toe(s): Secondary | ICD-10-CM | POA: Diagnosis not present

## 2023-12-10 NOTE — Progress Notes (Signed)
 This patient presents to the office with chief complaint of long thick painful nails.  Patient says the nails are painful walking and wearing shoes.  This patient is unable to self treat.  This patient is unable to trim his  nails since she is unable to reach his  nails.  he presents to the office for preventative foot care services.  General Appearance  Alert, conversant and in no acute stress.  Vascular  Dorsalis pedis and posterior tibial  pulses are palpable  bilaterally.  Capillary return is within normal limits  bilaterally. Temperature is within normal limits  bilaterally.  Neurologic  Senn-Weinstein monofilament wire test within normal limits  bilaterally. Muscle power within normal limits bilaterally.  Nails Thick disfigured discolored nails with subungual debris  from hallux to fifth toes bilaterally. No evidence of bacterial infection or drainage bilaterally.  Orthopedic  No limitations of motion  feet .  No crepitus or effusions noted.  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  Skin  normotropic skin with no porokeratosis noted bilaterally.  No signs of infections or ulcers noted.     Onychomycosis  Nails  B/L.  Pain in right toes  Pain in left toes  Debridement of nails both feet followed trimming the nails with dremel tool.    RTC 3 months.   Ruffin Cotton DPM

## 2024-03-11 ENCOUNTER — Ambulatory Visit (INDEPENDENT_AMBULATORY_CARE_PROVIDER_SITE_OTHER): Admitting: Podiatry

## 2024-03-11 ENCOUNTER — Encounter: Payer: Self-pay | Admitting: Podiatry

## 2024-03-11 DIAGNOSIS — B351 Tinea unguium: Secondary | ICD-10-CM

## 2024-03-11 DIAGNOSIS — M79674 Pain in right toe(s): Secondary | ICD-10-CM | POA: Diagnosis not present

## 2024-03-11 DIAGNOSIS — M79675 Pain in left toe(s): Secondary | ICD-10-CM | POA: Diagnosis not present

## 2024-03-11 NOTE — Progress Notes (Signed)
 This patient presents to the office with chief complaint of long thick painful nails.  Patient says the nails are painful walking and wearing shoes.  This patient is unable to self treat.  This patient is unable to trim his  nails since she is unable to reach his  nails.  he presents to the office for preventative foot care services.  General Appearance  Alert, conversant and in no acute stress.  Vascular  Dorsalis pedis and posterior tibial  pulses are palpable  bilaterally.  Capillary return is within normal limits  bilaterally. Temperature is within normal limits  bilaterally.  Neurologic  Senn-Weinstein monofilament wire test within normal limits  bilaterally. Muscle power within normal limits bilaterally.  Nails Thick disfigured discolored nails with subungual debris  from hallux to fifth toes bilaterally. No evidence of bacterial infection or drainage bilaterally.  Orthopedic  No limitations of motion  feet .  No crepitus or effusions noted.  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  Skin  normotropic skin with no porokeratosis noted bilaterally.  No signs of infections or ulcers noted.     Onychomycosis  Nails  B/L.  Pain in right toes  Pain in left toes  Debridement of nails both feet followed trimming the nails with dremel tool.    RTC 10 weeks   Cordella Bold DPM

## 2024-04-21 ENCOUNTER — Ambulatory Visit: Admitting: Neurology

## 2024-04-21 ENCOUNTER — Telehealth: Payer: Self-pay | Admitting: Neurology

## 2024-04-21 ENCOUNTER — Encounter: Payer: Self-pay | Admitting: Neurology

## 2024-04-21 VITALS — BP 106/62 | HR 55 | Ht 71.0 in | Wt 161.0 lb

## 2024-04-21 DIAGNOSIS — R001 Bradycardia, unspecified: Secondary | ICD-10-CM

## 2024-04-21 DIAGNOSIS — F02B18 Dementia in other diseases classified elsewhere, moderate, with other behavioral disturbance: Secondary | ICD-10-CM

## 2024-04-21 DIAGNOSIS — R55 Syncope and collapse: Secondary | ICD-10-CM

## 2024-04-21 DIAGNOSIS — Z85820 Personal history of malignant melanoma of skin: Secondary | ICD-10-CM

## 2024-04-21 MED ORDER — DONEPEZIL HCL 5 MG PO TABS: 5.0000 mg | ORAL_TABLET | Freq: Every morning | ORAL | 1 refills | Status: AC

## 2024-04-21 NOTE — Telephone Encounter (Signed)
 Referral to Psychology sent thru epic to [facility]  [facility] Phone : [phone]

## 2024-04-21 NOTE — Patient Instructions (Addendum)
 There are well-accepted and sensible ways to reduce risk for Dementia  -degenerative brain disorders .  Exercise Daily Walk A daily 20 minute walk should be part of your routine. Disease related apathy can be a significant roadblock to exercise and the only way to overcome this is to make it a daily routine and perhaps have a reward at the end (something your loved one loves to eat or drink perhaps) or a personal trainer coming to the home can also be very useful. Most importantly, the patient is much more likely to exercise if the caregiver / spouse does it with him/her. In general a structured, repetitive schedule is best.  General Health: Any diseases which effect your body will effect your brain such as a pneumonia, urinary infection, blood clot, heart attack or stroke. Keep contact with your primary care doctor for regular follow ups.  Sleep. A good nights sleep is healthy for the brain. Seven hours is recommended. If you have insomnia or poor sleep habits we can give you some instructions. If you have sleep apnea wear your mask.  Diet: Eating a heart healthy diet is also a good idea; fish and poultry instead of red meat, nuts (mostly non-peanuts), vegetables, fruits, olive oil or canola oil (instead of butter), minimal salt (use other spices to flavor foods), whole grain rice, bread, cereal and pasta and wine in moderation.Research is now showing that the MIND diet, which is a combination of The Mediterranean diet and the DASH diet, is beneficial for cognitive processing and longevity. Information about this diet can be found in The MIND Diet, a book by Annitta Feeling, MS, RDN, and online at wildwildscience.es  Finances, Power of [address] and Advance Directives: You should consider putting legal safeguards in place with regard to financial and medical decision making. While the spouse always has power of attorney for medical and financial issues in the absence of any form,  you should consider what you want in case the spouse / caregiver is no longer around or capable of making decisions.   The Alzheimers Association Position on Disease Prevention  Can Alzheimer's be prevented? It's a question that continues to intrigue researchers and fuel new investigations. There are no clear-cut answers yet -- partially due to the need for more large-scale studies in diverse populations -- but promising research is under way. The Alzheimer's Association is leading the worldwide effort to find a treatment for Alzheimer's, delay its onset and prevent it from developing.   What causes Alzheimer's? Experts agree that in the vast majority of cases, Alzheimer's, like other common chronic conditions, probably develops as a result of complex interactions among multiple factors, including age, genetics, environment, lifestyle and coexisting medical conditions. Although some risk factors -- such as age or genes -- cannot be changed, other risk factors -- such as high blood pressure and lack of exercise -- usually can be changed to help reduce risk. Research in these areas may lead to new ways to detect those at highest risk.  Prevention studies A small percentage of people with Alzheimer's disease (less than 1 percent) have an early-onset type associated with genetic mutations. Individuals who have these genetic mutations are guaranteed to develop the disease. An ongoing clinical trial conducted by the [facility] ([facility]), is testing whether antibodies to beta-amyloid can reduce the accumulation of beta-amyloid plaque in the brains of people with such genetic mutations and thereby reduce, delay or prevent symptoms. Participants in the trial are receiving antibodies (or placebo)  before they develop symptoms, and the development of beta-amyloid plaques is being monitored by brain scans and other tests.  Another clinical trial, known as the A4 trial (Anti-Amyloid Treatment  in Asymptomatic Alzheimer's), is testing whether antibodies to beta-amyloid can reduce the risk of Alzheimer's disease in older people (ages 95 to 60) at high risk for the disease. The A4 trial is being conducted by the Alzheimer's Disease Cooperative Study.  Though research is still evolving, evidence is strong that people can reduce their risk by making key lifestyle changes, including participating in regular activity and maintaining good heart health. Based on this research, the Alzheimer's Association offers 10 Ways to Love Your Brain -- a collection of tips that can reduce the risk of cognitive decline.  Heart-head connection  New research shows there are things we can do to reduce the risk of mild cognitive impairment and dementia.  Several conditions known to increase the risk of cardiovascular disease -- such as high blood pressure, diabetes and high cholesterol -- also increase the risk of developing Alzheimer's. Some autopsy studies show that as many as 80 percent of individuals with Alzheimer's disease also have cardiovascular disease.  A longstanding question is why some people develop hallmark Alzheimer's plaques and tangles but do not develop the symptoms of Alzheimer's. Vascular disease may help researchers eventually find an answer. Some autopsy studies suggest that plaques and tangles may be present in the brain without causing symptoms of cognitive decline unless the brain also shows evidence of vascular disease. More research is needed to better understand the link between vascular health and Alzheimer's.  Physical exercise and diet Regular physical exercise may be a beneficial strategy to lower the risk of Alzheimer's and vascular dementia. Exercise may directly benefit brain cells by increasing blood and oxygen flow in the brain. Because of its known cardiovascular benefits, a medically approved exercise program is a valuable part of any overall wellness plan.  Current evidence  suggests that heart-healthy eating may also help protect the brain. Heart-healthy eating includes limiting the intake of sugar and saturated fats and making sure to eat plenty of fruits, vegetables, and whole grains. No one diet is best. Two diets that have been studied and may be beneficial are the DASH (Dietary Approaches to Stop Hypertension) diet and the Mediterranean diet. The DASH diet emphasizes vegetables, fruits and fat-free or low-fat dairy products; includes whole grains, fish, poultry, beans, seeds, nuts and vegetable oils; and limits sodium, sweets, sugary beverages and red meats. A Mediterranean diet includes relatively little red meat and emphasizes whole grains, fruits and vegetables, fish and shellfish, and nuts, olive oil and other healthy fats.  Social connections and intellectual activity A number of studies indicate that maintaining strong social connections and keeping mentally active as we age might lower the risk of cognitive decline and Alzheimer's. Experts are not certain about the reason for this association. It may be due to direct mechanisms through which social and mental stimulation strengthen connections between nerve cells in the brain.  Head trauma There appears to be a strong link between future risk of Alzheimer's and serious head trauma, especially when injury involves loss of consciousness. You can help reduce your risk of Alzheimer's by protecting your head.  Wear a seat belt  Use a helmet when participating in sports  Fall-proof your home   What you can do now While research is not yet conclusive, certain lifestyle choices, such as physical activity and diet, may help support brain health and  prevent Alzheimer's. Many of these lifestyle changes have been shown to lower the risk of other diseases, like heart disease and diabetes, which have been linked to Alzheimer's. With few drawbacks and plenty of known benefits, healthy lifestyle choices can improve your  health and possibly protect your brain.  Learn more about brain health. You can help increase our knowledge by considering participation in a clinical study. Our free clinical trial matching services, TrialMatch, can help you find clinical trials in your area that are seeking volunteers.  Understanding prevention research Here are some things to keep in mind about the research underlying much of our current knowledge about possible prevention:  Insights about potentially modifiable risk factors apply to large population groups, not to individuals. Studies can show that factor X is associated with outcome Y, but cannot guarantee that any specific person will have that outcome. As a result, you can do everything right and still have a serious health problem or do everything wrong and live to be 100.  Much of our current evidence comes from large epidemiological studies such as the [place] Aging Study, the Nurses' Health Study, the Adult Changes in Thought Study and the [employer]. These studies explore pre-existing behaviors and use statistical methods to relate those behaviors to health outcomes. This type of study can show an association between a factor and an outcome but cannot prove cause and effect. This is why we describe evidence based on these studies with such language as suggests, may show, might protect, and is associated with.  The gold standard for showing cause and effect is a clinical trial in which participants are randomly assigned to a prevention or risk management strategy or a control group. Researchers follow the two groups over time to see if their outcomes differ significantly.  It is unlikely that some prevention or risk management strategies will ever be tested in randomized trials for ethical or practical reasons. One example is exercise. Definitively testing the impact of exercise on Alzheimer's risk would require a huge trial enrolling thousands of  people and following them for many years. The expense and logistics of such a trial would be prohibitive, and it would require some people to go without exercise, a known health benefit.     Donepezil  Tablets ARICEPT   What is this medication? DONEPEZIL  (doe NEP e zil) treats memory loss and confusion (dementia) in people who have Alzheimer disease. It works by improving attention, memory, and the ability to engage in daily activities. It is not a cure for dementia or Alzheimer disease. This medicine may be used for other purposes; ask your health care provider or pharmacist if you have questions. COMMON BRAND NAME(S): Aricept  What should I tell my care team before I take this medication? They need to know if you have any of these conditions: HeProblems With Thinking and Memory (Mild Neurocognitive Disorder): What to Know Mild neurocognitive disorder, formerly known as mild cognitive impairment, is a disorder where your memory doesn't work as well as it should. It may also affect other mental abilities like thinking, communicating, behavior, and being able to finish tasks. These problems can be noticed and measured. But they usually don't stop you from doing daily activities or living on your own. Mild neurocognitive disorder usually happens after 85 years of age. But it can also happen at younger ages. It's not as serious as major neurocognitive disorder, also known as dementia, but it may be the first sign of it. In general, the symptoms of this  condition get worse over time. In rare cases, symptoms can get better. What are the causes? This condition may be caused by: Brain disorders like Alzheimer's disease, Parkinson's disease, and other conditions that slowly damage nerve cells. Diseases that affect the blood vessels in the brain and cause small strokes. Certain infections, like HIV. Traumatic brain injury. Other medical conditions, such as brain tumors, underactive thyroid  (hypothyroidism),  and not having enough vitamin B12. Using certain drugs or medicines. What increases the risk? Being older than 85 years of age. Being male. Having a lower level of education. Diabetes, high blood pressure, high cholesterol, and other conditions that raise the risk for blood vessel diseases. Untreated or undertreated sleep apnea. Having a certain type of gene that can be inherited, or passed down from parent to child. Long-term health problems like heart disease, lung disease, liver disease, kidney disease, or depression. What are the signs or symptoms? Trouble remembering things. You may: Forget names, phone numbers, or details of recent events. Forget about social events and appointments. Often forget where you put your car keys or other items. Trouble thinking and solving problems. You may have trouble with complex tasks like: Paying bills. Driving in places you don't know well. Trouble communicating. You may have trouble: Finding the right word or naming an object. Forming a sentence that makes sense. Understanding what you read or hear. Changes in your behavior or personality. When this happens, you may: Lose interest in the things you used to enjoy. Avoid being around people. Get angry more easily than usual. Act before thinking. How is this diagnosed? This condition is diagnosed based on: Your symptoms. Your health care provider may ask you and the people you spend time with, like family and friends, about your symptoms. Memory tests and other tests to check how your brain is working. Your provider may refer you to a provider called a neurologist or a mental health specialist. To try to find out the cause of your condition, your provider may: Get a detailed medical history. Ask about use of alcohol, drugs, and medicines. Do a physical exam. Order blood tests and brain imaging tests. How is this treated? Mild neurocognitive disorder that's caused by medicine use, drug use,  infection, or another medical condition may get better when the cause is treated, or when medicines or drugs are stopped. If this disorder has another cause, it usually doesn't improve and may get worse. In these cases, the goal of treatment is to help you manage the symptoms. This may include: Medicines to help with memory and behavior symptoms. Talk therapy. This provides education, emotional support, memory aids, and other ways of making up for problems with mental tasks. Lifestyle changes. These may include: Getting regular exercise. Eating a healthy diet that includes omega-3 fatty acids. Doing things to challenge your thinking and memory skills. Spending more time being with and talking to other people. Using routines like having regular times for meals and going to bed. Follow these instructions at home: Eating and drinking  Drink more fluids as told. Eat a healthy diet that includes omega-3 fatty acids. These can be found in: Fish. Nuts. Leafy vegetables. Vegetable oils. If you drink alcohol: Limit how much you have to: 0-1 drink a day if you're male. 0-2 drinks a day if you're male. Know how much alcohol is in your drink. In the [place] one drink is one 12 oz bottle of beer (355 mL), one 5 oz glass of wine (148 mL), or one 1  oz glass of hard liquor (44 mL). Lifestyle  Get regular exercise as told by your provider. Do not smoke, vape, or use nicotine or tobacco. Use healthy ways to manage stress. If you need help managing stress, ask your provider. Keep spending time with other people. Keep your mind active by doing activities you enjoy, like reading or playing games. Make sure you get good sleep at night. These tips can help: Try not to take naps during the day. Keep your bedroom dark and cool. Do not exercise in the few hours before you go to bed. Do not have foods or drinks with caffeine at night. General instructions Take medicines only as told. Your provider may  tell you to avoid taking medicines that can affect thinking. These include some medicines for pain or sleeping. Work with your provider to find out: What things you need help with. What your safety needs are. Where to find more information [employer] on Aging: [email or web address].pl Contact a health care provider if: You have any new symptoms. Get help right away if: You have new confusion or your confusion gets worse. You act in ways that put you or your family in danger. This information is not intended to replace advice given to you by your health care provider. Make sure you discuss any questions you have with your health care provider. Document Revised: 10/16/2022 Document Reviewed: 10/16/2022 Elsevier Patient Education  2024 Else[employer].ad injury Heart disease Irregular heartbeat or rhythm Liver disease Lung or breathing disease, such as asthma Seizures Stomach ulcers, other stomach or intestine problems Stomach bleeding Trouble passing urine An unusual or allergic reaction to donepezil , other medications, foods, dyes, or preservatives Pregnant or trying to get pregnant Breastfeeding How should I use this medication? Take this medication by mouth with a glass of water. Follow the directions on the prescription label. You may take this medication with or without food. Take this medication at regular intervals. This medication is usually taken before bedtime. Do not take it more often than directed. Continue to take your medication even if you feel better. Do not stop taking except on your care team's advice. If you are taking the 23 mg donepezil  tablet, swallow it whole; do not cut, crush, or chew it. Talk to your care team about the use of this medication in children. Special care may be needed. Overdosage: If you think you have taken too much of this medicine contact a poison control center or emergency room at once. NOTE: This medicine is only for you. Do not share this medicine  with others. What if I miss a dose? If you miss a dose, take it as soon as you can. If it is almost time for your next dose, take only that dose, do not take double or extra doses. What may interact with this medication? Do not take this medication with any of the following: Certain medications for fungal infections, such as itraconazole, fluconazole, posaconazole, voriconazole Cisapride Dextromethorphan; quinidine Dronedarone Pimozide Quinidine Thioridazine This medication may also interact with the following: Antihistamines for allergy, cough, and cold Atropine Bethanechol Carbamazepine Certain medications for bladder problems, such as oxybutynin or tolterodine Certain medications for Parkinson disease, such as benztropine or trihexyphenidyl Certain medications for stomach problems, such as dicyclomine or hyoscyamine Certain medications for travel sickness, such as scopolamine Dexamethasone  Dofetilide Ipratropium NSAIDs, medications for pain and inflammation, such as ibuprofen or naproxen Other medications for Alzheimer disease Other medications that cause heart rhythm changes Phenobarbital Phenytoin Rifampin, rifabutin, or rifapentine  Ziprasidone This list may not describe all possible interactions. Give your health care provider a list of all the medicines, herbs, non-prescription drugs, or dietary supplements you use. Also tell them if you smoke, drink alcohol, or use illegal drugs. Some items may interact with your medicine. What should I watch for while using this medication? Visit your care team for regular checks on your progress. Tell your care team if your symptoms do not start to get better or if they get worse. This medication may affect your coordination, reaction time, or judgment. Do not drive or operate machinery until you know how this medication affects you. Sit up or stand slowly to reduce the risk of dizzy or fainting spells. Drinking alcohol with this  medication can increase the risk of these side effects. What side effects may I notice from receiving this medication? Side effects that you should report to your care team as soon as possible: Allergic reactions--skin rash, itching, hives, swelling of the face, lips, tongue, or throat Peptic ulcer--burning stomach pain, loss of appetite, bloating, burping, heartburn, nausea, vomiting Seizures Slow heartbeat--dizziness, feeling faint or lightheaded, confusion, trouble breathing, unusual weakness or fatigue Stomach bleeding--bloody or black, tar-like stools, vomiting blood or brown material that looks like coffee grounds Trouble passing urine Side effects that usually do not require medical attention (report these to your care team if they continue or are bothersome): Diarrhea Fatigue Loss of appetite Muscle pain or cramps Nausea Trouble sleeping This list may not describe all possible side effects. Call your doctor for medical advice about side effects. You may report side effects to FDA at 1-800-FDA-1088. Where should I keep my medication? Keep out of reach of children. Store at room temperature between 15 and 30 degrees C (59 and 86 degrees F). Throw away any unused medication after the expiration date. NOTE: This sheet is a summary. It may not cover all possible information. If you have questions about this medicine, talk to your doctor, pharmacist, or health care provider.  2024 Elsevier/Gold Standard (2022-11-01 00:00:00)Dementia Caregiver Guide Dementia is a condition that affects the way the brain works. It often affects thinking and memory. A person with dementia may: Forget things. Have trouble talking or responding to your questions. Have trouble paying attention. Have trouble thinking clearly and making good decisions. Get lost or wander away from home or other places. Have big changes in their mood or emotions. They may: Feel very worried, nervous, or depressed. Have angry  outbursts. Be suspicious or accuse you of things. Have childlike behavior and language. Taking care of someone with dementia can be a challenge. The tips below can help you care for the person. How to help manage lifestyle changes Dementia usually gets worse slowly over time. In the early stages, people with dementia can stay safe and take care of themselves with some help. In later stages, they need help with daily tasks like getting dressed, grooming, and going to the bathroom. Communicating When the person is talking and seems frustrated, make eye contact and hold the person's hand. Ask questions that can be answered with a yes or no. Use simple words and a calm voice. Only give one direction at a time. Limit choices for the person. Too many choices can be stressful. Avoid correcting the person in a negative way. If the person can't find the right words, gently try to help. Preventing injury  Keep floors clear. Remove rugs, magazine racks, and floor lamps. Keep hallways well lit, especially at night. Put  a handrail and nonslip mat in the bathtub or shower. Put childproof locks on cabinets that have dangerous items in them. These items include medicine, alcohol, guns, cleaning products, and sharp tools. For doors to the outside, put locks where the person can't see or reach them. This helps keep the person from going out of the house and getting lost. Be ready for emergencies. Keep a list of emergency phone numbers and addresses close by. Remove car keys and lock garage doors so the person doesn't try to drive. Have the person wear a bracelet that tracks where they are and shows that they're a person with memory loss. This should be worn at all times for safety. Helping with daily life  Keep the person on track with their daily routine. Try to identify areas where the person may need help. Be supportive, patient, calm, and encouraging. Gently remind the person that adjusting to changes  takes time. Help with the tasks that the person has asked for help with. Keep the person involved in daily tasks and decisions as much as you can. Encourage conversation, but try not to get frustrated if the person struggles to find words or doesn't seem to appreciate your help. Other tips Think about any safety risks and take steps to avoid them. Keep things organized: Organize medicines in a pill box for each day of the week. Keep a calendar in a central place. Use it to remind the person of health care visits or other activities. Create a plan to handle any legal or financial matters. Get help from a professional if needed. Help make sure the person: Takes medicines only as told by their health care providers. Eats regular, healthy meals. They should also drink plenty of fluids. Goes to all scheduled health care appointments. Gets regular sleep. Taking care of yourself Being a caregiver for someone with dementia can be hard. You may feel stressed and have many other emotions. It's important to also take care of yourself. Here are some tips: Find out about services that can provide short-term care for the person. This is called respite care. It can allow you to take a break when you need one. Find healthy ways to deal with stress. Some ways include: Spending time with other people. Exercising. Meditating or doing deep breathing exercises. Take care of your own health by: Getting enough sleep. Eating healthy foods. Getting regular exercise. Join a support group with others who are caregivers. These groups can help you: Learn other ways to deal with stress. Share experiences with others. Get emotional comfort and support. Learn about caregiving as the disease gets worse. Find resources in your community. Where to find support: Many people and organizations offer support. These include: Support groups for people with dementia. Support groups for caregivers. Counselors or  therapists. Home health care services. Adult day care centers. Where to find more information Alzheimer's Association: westerntunes.it Family Caregiver Alliance: caregiver.org Alzheimer's Foundation of America: alzfdn.org Contact a health care provider if: The person's health is quickly getting worse. You're no longer able to care for the person. Caring for the person is affecting your physical and emotional health. You're feeling worried, nervous, or depressed about caring for the person. Get help right away if: You feel like the person may hurt themselves or others. The person has talked about taking their own life. These symptoms may be an emergency. Take one of these steps right away: Go to your nearest emergency room. Call 911. Call the [facility] Suicide Prevention Lifeline  at 4013032490 or 988. Text the Crisis Text Line at 719-046-8911. This information is not intended to replace advice given to you by your health care provider. Make sure you discuss any questions you have with your health care provider. Document Revised: 08/03/2022 Document Reviewed: 08/03/2022 Elsevier Patient Education  2024 Elsevier Inc.Alzheimer's Disease Caregiver Guide Alzheimer's disease is a condition that makes a person: Forget things. Act differently. Have trouble paying attention and doing simple tasks. Have trouble talking and responding to your questions. These things get worse with time. The tips below can help you care for the person. How to help manage lifestyle changes Tips to help with symptoms Be calm and patient. Give simple, short answers to questions. Long answers can confuse the person. Avoid correcting the person in a negative way. Do not argue with the person. This may make the person more upset. Try not to take things personally, even if the person forgets your name. Tips to lessen frustration Use simple words and a calm voice. Only give one direction at a time. Do daily tasks, like  bathing and dressing, when the person is at their best. Also make any appointments for these times. Take your time. Simple tasks may take longer. Allow plenty of time to get tasks done. Limit choices for the person. Too many choices can be stressful. Involve the person in what you're doing. Keep things organized: Keep a daily routine. Organize medicines in a pillbox for each day of the week. Keep a calendar in a central place. Use it to remind the person of health care visits or other activities. Avoid new or crowded places, if possible. Buy clothes and shoes for the person that are easy to put on and take off. Try to change the subject if the person becomes frustrated or angry. This is a great way to make things less tense. Tips to prevent injury  Keep floors clear. Remove rugs, magazine racks, and floor lamps. Keep hallways well-lit, especially at night. Put a handrail and nonslip mat in the bathtub or shower. Put childproof locks on cabinets that have dangerous items in them. These items include medicine, alcohol, guns, cleaning products, and sharp tools. Put locks on doors where the person can't see or reach them. This helps keep the person from going out of the house and getting lost. Remove car keys and lock garage doors so the person doesn't try to drive. Be ready for emergencies. Keep a list of emergency phone numbers and addresses close by. Bracelets may be worn that track location and identify the person as having memory loss. This should be worn at all times for safety. Tips for the future  Discuss financial and legal planning early. Get help from a professional. People with this disease have trouble managing their money as the disease gets worse. Talk about advance directives, safety, and daily care. Take these steps: Create a living will and choose a power of attorney. This is someone who can make decisions for the person with Alzheimer's disease when they can no longer do  so. Discuss driving safety and when to stop driving. The person's health care provider can help with this. If the person lives alone, make sure they're safe. Some people need extra help at home. Other people need more care at a nursing home or care center. How to recognize changes in the person's condition With this disease, memory problems and confusion slowly get worse. In time, the person may not know their friends and family members. The  disease can cause changes in behavior and mood, such as anger or feeling worried or nervous. The person may also hallucinate. This means they see, hear, taste, smell, or feel things that aren't real. These changes can come on all of a sudden. They may happen in response to something such as: Pain. An infection. Changes in temperature. Too much stimulation or noise. Feeling lost or scared. Medicines. Where to find support Find out about services that can provide short-term care for the person. This is called respite care. It can allow you to take a break when you need one. Join a support group near you. These groups can help you: Learn ways to manage stress. Share experiences with others. Get emotional comfort and support. Learn about caregiving as the disease gets worse. Find community resources. Where to find more information Alzheimer's Association: westerntunes.it Contact a health care provider if: The person has a fever. The person has a sudden change in behavior that doesn't get better when you try to help calm them. The person has a sudden increase in confusion or new hallucinations. The person is not able to take care of themselves at home. You are no longer able to care for the person. Get help right away if: You feel like the person may hurt themselves or others. The person has talked about taking their own life. These symptoms may be an emergency. Take one of these steps right away: Go to your nearest emergency room. Call 911. Call the  National Suicide Prevention Lifeline at 610-070-2371 or 988. Text the Crisis Text Line at (732)696-4045. This information is not intended to replace advice given to you by your health care provider. Make sure you discuss any questions you have with your health care provider. Document Revised: 01/24/2023 Document Reviewed: 06/21/2022 Elsevier Patient Education  2024 [employer].

## 2024-04-21 NOTE — Progress Notes (Signed)
 Provider:  Dedra Gores, MD  Primary Care Physician:  Okey Carlin Redbird, MD [address] [place] [address]     Referring Provider: Okey Carlin Redbird, Md [address] [place],  [place] [address]          Chief Complaint according to patient   Patient presents with:                HISTORY OF PRESENT ILLNESS:  Henry Gaines is a 83 y.o. male patient who is here for revisit 04/21/2024 for  Memory loss. He just got his drivers permit renewed.     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  The patient tries to explain his getting lost- and ends in confabulation.  He is not sowing any insight- unfortunately he speaks about planning to go to [place]. He has a beach house and wants to go there.   He also reported Diplopia  when driving today (!)> .   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 AD biomarkers.  APO E3/ E4  genetic.  No metabolic problems.  MRI normal for age - no vascular dementia    Paranoid ideas about someone stealing when misplacing items, avoiding in home caregivers ( leaving when home health was supposed to come ) . He lost his smart watch, he has 6 hearing aids and not uses any.    Vagovagal syncope.   Sensorineural hearing loss (SNHL) of both ears  History of diplopia  Cogwheel rigidity  History of malignant melanoma of skin  CAD     Fam Hx : see previous note  Social HX; see previous note  , widowed but sister in law has a smaller dwelling on the same property.      Review of Systems: Out of a complete 14 system review, the patient complains of only the following symptoms, and all other reviewed systems are negative.:      04/21/2024    8:22 AM 05/22/2023   11:01 AM  Montreal Cognitive Assessment   Visuospatial/  Executive (0/5) 4 2  Naming (0/3) 3 3  Attention: Read list of digits (0/2) 2 1  Attention: Read list of letters (0/1) 1 1  Attention: Serial 7 subtraction starting at 100 (0/3) 2 3  Language: Repeat phrase (0/2) 1 0  Language : Fluency (0/1) 1 1  Abstraction (0/2) 2 1  Delayed Recall (0/5) 0 0  Orientation (0/6) 3 4  Total 19 16         Social History   Socioeconomic History   Marital status: Widowed    Spouse name: Not on file   Number of children: Not on file   Years of education: Not on file   Highest education level: Not on file  Occupational History   Not on file  Tobacco Use   Smoking status: Former   Smokeless tobacco: Never   Tobacco comments:    30 years ago  Substance and Sexual Activity   Alcohol use: Yes    Alcohol/week: 7.0 standard drinks of alcohol    Types: 7 Glasses of wine per week   Drug use: Never   Sexual activity: Not Currently    Partners: Male  Other Topics Concern   Not on file  Social  History Narrative   Pt lives with granddaughter    Retired    Social Drivers of Health   Tobacco Use: Medium Risk (04/21/2024)   Patient History    Smoking Tobacco Use: Former    Smokeless Tobacco Use: Never    Passive Exposure: Not on Actuary Strain: Not on file  Food Insecurity: Not on file  Transportation Needs: Not on file  Physical Activity: Not on file  Stress: Not on file  Social Connections: Not on file  Depression (EYV7-0): Not on file  Alcohol Screen: Not on file  Housing: Unknown (06/21/2023)   Received from [facility]   Epic    Unable to Pay for Housing in the Last Year: Not on file    Number of Times Moved in the Last Year: Not on file    At any time in the past 12 months, were you homeless or living in a shelter (including now)?: No  Utilities: Not on file  Health Literacy: Not on file    Family History  Problem Relation Age of Onset   Alzheimer's disease Neg Hx     Past Medical  History:  Diagnosis Date   History of heart bypass surgery    HOH (hard of hearing)    Hypertension    Skin cancer (melanoma) (HCC)    on right arm    Past Surgical History:  Procedure Laterality Date   CATARACT EXTRACTION Bilateral      Medications Ordered Prior to Encounter[1]  Allergies[2]   DIAGNOSTIC DATA (LABS, IMAGING, TESTING) - I reviewed patient records, labs, notes, testing and imaging myself where available.  Lab Results  Component Value Date   WBC 4.9 05/18/2020   HGB 12.5 (L) 05/18/2020   HCT 36.9 (L) 05/18/2020   MCV 94.6 05/18/2020   PLT 156 05/18/2020      Component Value Date/Time   NA 138 05/19/2020 0337   K 3.7 05/19/2020 0337   CL 102 05/19/2020 0337   CO2 26 05/19/2020 0337   GLUCOSE 103 (H) 05/19/2020 0337   BUN 13 05/19/2020 0337   CREATININE 0.85 05/19/2020 0337   CALCIUM 8.6 (L) 05/19/2020 0337   PROT 6.4 05/22/2023 1212   ALBUMIN 3.6 01/24/2010 2050   AST 15 01/24/2010 2050   ALT 12 01/24/2010 2050   ALKPHOS 66 01/24/2010 2050   BILITOT 1.0 01/24/2010 2050   GFRNONAA >60 05/19/2020 0337   GFRAA (L) 01/24/2010 2050    55        The eGFR has been calculated using the MDRD equation. This calculation has not been validated in all clinical situations. eGFR's persistently <60 mL/min signify possible Chronic Kidney Disease.   No results found for: CHOL, HDL, LDLCALC, LDLDIRECT, TRIG, CHOLHDL Lab Results  Component Value Date   HGBA1C 5.7 (H) 05/18/2020   Lab Results  Component Value Date   VITAMINB12 574 05/22/2023   Lab Results  Component Value Date   TSH 1.660 05/22/2023    PHYSICAL EXAM:  Vitals:   04/21/24 0804  BP: 106/62  Pulse: (!) 55   No data found. Body mass index is 22.45 kg/m.   Wt Readings from Last 3 Encounters:  04/21/24 161 lb (73 kg)  05/22/23 170 lb (77.1 kg)  05/19/20 221 lb 1.9 oz (100.3 kg)     Ht Readings from Last 3 Encounters:  04/21/24 5' 11 (1.803 m)  05/22/23 5' 11  (1.803 m)  05/18/20 5' 11 (1.803 m)  General: The patient is awake, alert and appears not in acute distress and groomed. Head: Normocephalic, atraumatic.  Neck is supple. Nasal airflow  patent.   Cardiovascular:  Regular rate and cardiac rhythm by pulse, without distended neck veins. Respiratory: no shortness of breath  Skin:  Without evidence of ankle edema, or rash. Trunk: BMI is 22.45    NEUROLOGIC EXAM: The patient is awake and alert, partial oriented to place and time.   Memory subjective described as intact.- and it is severely impaired.   Attention span & concentration ability appears limited, he is defensive.   Speech is fluent,  without  dysarthria, dysphonia or aphasia.  Mood and affect are appropriate.   Neurological Examination: Mental Status: Intact. Language and speech are normal. No cognitive deficits. Cranial Nerves II-XII: Intact. PERL. EOMI. VFF. No nystagmus.  No facial droop.  No ptosis.  Hearing is severely impaired.  The tongue is normal and midline. Motor: Strengths are 5/5 throughout. Muscle bulk and tone are normal. No tremors.  Coordination: No ataxia or dysmetria.  Sensory: Grossly intact throughout to all modalities. Reflexes: Normal and symmetric throughout. No ankle clonus. Babinski's sign is absent bilaterally. Hoffman's sign is absent bilaterally. Gait and Station: Normal. Romberg's sign is absent.   ASSESSMENT AND PLAN :   83 y.o. year old male  here with:    1) AD, moderately neurocognitive major disorder , (Alzheimer's disease ) , his MOCA score no longer permits use of any anti -amyloid therapy.  He has nocturnal vivid dreams that he believes are real.   2) no insight in  driving  restriction , does not restrict himself to local driving. 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   3) family needs help to  sand box him safely with maximal independence in the home and on his  grounds.  Plan : neuro psychology testing, Home health for medication monitoring. Home safety evaluation.  Driving evaluation.  I doubt he will retain any restriction information.   Aricept  5 mg to start  Regular meals- lunch daily at the office with daughter.   This  patient with Alzheimer's Disease was cautioned not to drive or operate dangerous or heavy equipment .   I will offer a RV in 6 months with MM[employer].     After spending a total time of  45  minutes face to face and time for  history taking, physical and neurologic examination, review of laboratory studies,  personal review of imaging studies, reports and results of other testing and review of referral information / records as far as provided in visit,   Electronically signed by: Dedra Gores, MD 04/21/2024 8:36 AM  [facility] Sleep Board certified by The [employer] of Sleep Medicine and Diplomate of the [employer] of Sleep Medicine. Board certified In Neurology through the ABPN, Fellow of the [employer] of Neurology.      [1]  Current Outpatient Medications on File Prior to Visit  Medication Sig Dispense Refill   Ascorbic Acid (VITAMIN C) 500 MG CAPS Take 1 capsule by mouth daily.     aspirin EC 81 MG tablet Take 81 mg by mouth daily. Swallow whole.     Cholecalciferol (VITAMIN D-3) 125 MCG (5000 UT) TABS Take 1 tablet by mouth daily.     Coenzyme Q10 (COQ10) 100 MG CAPS Take by mouth.     Cyanocobalamin (VITAMIN B-12) 2500 MCG SUBL Place 1 tablet  under the tongue daily.     doxazosin  (CARDURA ) 2 MG tablet Take 2 mg by mouth every evening.  4   folic acid (FOLVITE) 400 MCG tablet Take 400 mcg by mouth daily.     Lactase (DAIRY DIGESTIVE PO) Take 1 tablet by mouth daily.     magnesium  gluconate (MAGONATE) 500 MG tablet Take 500 mg by mouth every evening.     metoprolol  tartrate (LOPRESSOR ) 25 MG tablet Take 1 tablet (25 mg total) by mouth 2 (two) times daily. 60  tablet 0   Misc Natural Products (JOINT HEALTH PO) Take 1 tablet by mouth daily.     Multiple Vitamins-Minerals (ZINC PO) Take 1 tablet by mouth daily.     niacin 250 MG CR capsule Take 250 mg by mouth 2 (two) times daily with a meal.     Omega-3 Krill Oil 500 MG CAPS Take 1 capsule by mouth daily.     ramipril  (ALTACE ) 10 MG capsule Take 10 mg by mouth daily.  3   triamterene -hydrochlorothiazide  (MAXZIDE -25) 37.5-25 MG tablet Take 1 tablet by mouth daily.  1   No current facility-administered medications on file prior to visit.  [2] No Known Allergies

## 2024-04-22 ENCOUNTER — Telehealth: Payer: Self-pay | Admitting: Neurology

## 2024-04-22 NOTE — Telephone Encounter (Signed)
 [facility] is going to take this patient.

## 2024-05-20 ENCOUNTER — Ambulatory Visit: Admitting: Podiatry

## 2024-05-20 ENCOUNTER — Encounter: Payer: Self-pay | Admitting: Podiatry

## 2024-05-20 DIAGNOSIS — B351 Tinea unguium: Secondary | ICD-10-CM

## 2024-05-20 DIAGNOSIS — M79675 Pain in left toe(s): Secondary | ICD-10-CM | POA: Diagnosis not present

## 2024-05-20 DIAGNOSIS — M79674 Pain in right toe(s): Secondary | ICD-10-CM | POA: Diagnosis not present

## 2024-05-20 NOTE — Progress Notes (Signed)
 This patient presents to the office with chief complaint of long thick painful nails.  Patient says the nails are painful walking and wearing shoes.  This patient is unable to self treat.  This patient is unable to trim his  nails since she is unable to reach his  nails.  he presents to the office for preventative foot care services.  General Appearance  Alert, conversant and in no acute stress.  Vascular  Dorsalis pedis and posterior tibial  pulses are palpable  bilaterally.  Capillary return is within normal limits  bilaterally. Temperature is within normal limits  bilaterally.  Neurologic  Senn-Weinstein monofilament wire test within normal limits  bilaterally. Muscle power within normal limits bilaterally.  Nails Thick disfigured discolored nails with subungual debris  from hallux to fifth toes bilaterally. No evidence of bacterial infection or drainage bilaterally.  Orthopedic  No limitations of motion  feet .  No crepitus or effusions noted.  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  Skin  normotropic skin with no porokeratosis noted bilaterally.  No signs of infections or ulcers noted.     Onychomycosis  Nails  B/L.  Pain in right toes  Pain in left toes  Debridement of nails both feet followed trimming the nails with dremel tool.    RTC 10 weeks   Cordella Bold DPM

## 2024-05-22 ENCOUNTER — Encounter: Payer: Self-pay | Admitting: Psychology

## 2024-07-16 ENCOUNTER — Encounter: Admitting: Psychology

## 2024-07-29 ENCOUNTER — Ambulatory Visit: Admitting: Podiatry

## 2024-11-23 ENCOUNTER — Ambulatory Visit: Admitting: Adult Health
